# Patient Record
Sex: Female | Born: 2000 | Hispanic: No | Marital: Single | Smoking: Former smoker
Health system: Southern US, Community
[De-identification: ages and names within clinical notes are randomized; demographics above are authoritative.]

## PROBLEM LIST (undated history)

## (undated) DIAGNOSIS — F419 Anxiety disorder, unspecified: Secondary | ICD-10-CM

## (undated) DIAGNOSIS — F32A Depression, unspecified: Secondary | ICD-10-CM

## (undated) DIAGNOSIS — F909 Attention-deficit hyperactivity disorder, unspecified type: Secondary | ICD-10-CM

## (undated) DIAGNOSIS — F329 Major depressive disorder, single episode, unspecified: Secondary | ICD-10-CM

## (undated) DIAGNOSIS — Z8619 Personal history of other infectious and parasitic diseases: Secondary | ICD-10-CM

## (undated) DIAGNOSIS — U071 COVID-19: Secondary | ICD-10-CM

## (undated) HISTORY — DX: Personal history of other infectious and parasitic diseases: Z86.19

---

## 2018-07-15 ENCOUNTER — Emergency Department (HOSPITAL_COMMUNITY): Payer: BC Managed Care – PPO

## 2018-07-15 ENCOUNTER — Other Ambulatory Visit: Payer: Self-pay

## 2018-07-15 ENCOUNTER — Encounter (HOSPITAL_COMMUNITY): Payer: Self-pay

## 2018-07-15 ENCOUNTER — Emergency Department (HOSPITAL_COMMUNITY)
Admission: EM | Admit: 2018-07-15 | Discharge: 2018-07-15 | Disposition: A | Discharge status: Home / Self Care | Payer: BC Managed Care – PPO | Attending: Emergency Medicine | Admitting: Emergency Medicine

## 2018-07-15 DIAGNOSIS — N201 Calculus of ureter: Secondary | ICD-10-CM | POA: Diagnosis not present

## 2018-07-15 DIAGNOSIS — R109 Unspecified abdominal pain: Secondary | ICD-10-CM

## 2018-07-15 DIAGNOSIS — Z79899 Other long term (current) drug therapy: Secondary | ICD-10-CM | POA: Insufficient documentation

## 2018-07-15 HISTORY — DX: Depression, unspecified: F32.A

## 2018-07-15 HISTORY — DX: Anxiety disorder, unspecified: F41.9

## 2018-07-15 HISTORY — DX: Major depressive disorder, single episode, unspecified: F32.9

## 2018-07-15 LAB — URINALYSIS, ROUTINE W REFLEX MICROSCOPIC
Bilirubin Urine: NEGATIVE
Glucose, UA: NEGATIVE mg/dL
Ketones, ur: NEGATIVE mg/dL
Nitrite: NEGATIVE
Protein, ur: NEGATIVE mg/dL
RBC / HPF: >50 RBC/hpf — ABNORMAL HIGH (ref 0–5)
SPECIFIC GRAVITY, URINE: 1.006 (ref 1.005–1.030)
pH: 8 (ref 5.0–8.0)

## 2018-07-15 LAB — COMPREHENSIVE METABOLIC PANEL
ALT: 23 U/L (ref 0–44)
AST: 27 U/L (ref 15–41)
Albumin: 4.5 g/dL (ref 3.5–5.0)
Alkaline Phosphatase: 73 U/L (ref 38–126)
Anion gap: 8 (ref 5–15)
BUN: 8 mg/dL (ref 6–20)
CO2: 25 mmol/L (ref 22–32)
CREATININE: 0.82 mg/dL (ref 0.44–1.00)
Calcium: 9.3 mg/dL (ref 8.9–10.3)
Chloride: 106 mmol/L (ref 98–111)
GFR calc Af Amer: >60 mL/min (ref >60)
GFR calc non Af Amer: >60 mL/min (ref >60)
Glucose, Bld: 100 mg/dL — ABNORMAL HIGH (ref 70–99)
Potassium: 3.8 mmol/L (ref 3.5–5.1)
Sodium: 139 mmol/L (ref 135–145)
Total Bilirubin: 0.5 mg/dL (ref 0.3–1.2)
Total Protein: 8.5 g/dL — ABNORMAL HIGH (ref 6.5–8.1)

## 2018-07-15 LAB — CBC WITH DIFFERENTIAL/PLATELET
Abs Immature Granulocytes: 0.07 10*3/uL (ref 0.00–0.07)
Basophils Absolute: 0 10*3/uL (ref 0.0–0.1)
Basophils Relative: 0 %
Eosinophils Absolute: 0 10*3/uL (ref 0.0–0.5)
Eosinophils Relative: 0 %
HEMATOCRIT: 41.6 % (ref 36.0–46.0)
HEMOGLOBIN: 13.1 g/dL (ref 12.0–15.0)
Immature Granulocytes: 1 %
LYMPHS PCT: 9 %
Lymphs Abs: 0.9 10*3/uL (ref 0.7–4.0)
MCH: 28.2 pg (ref 26.0–34.0)
MCHC: 31.5 g/dL (ref 30.0–36.0)
MCV: 89.5 fL (ref 80.0–100.0)
MONO ABS: 0.5 10*3/uL (ref 0.1–1.0)
Monocytes Relative: 5 %
Neutro Abs: 8.5 10*3/uL — ABNORMAL HIGH (ref 1.7–7.7)
Neutrophils Relative %: 85 %
Platelets: 267 10*3/uL (ref 150–400)
RBC: 4.65 MIL/uL (ref 3.87–5.11)
RDW: 15.7 % — ABNORMAL HIGH (ref 11.5–15.5)
WBC: 10 10*3/uL (ref 4.0–10.5)
nRBC: 0 % (ref 0.0–0.2)

## 2018-07-15 LAB — LIPASE, BLOOD: Lipase: 31 U/L (ref 11–51)

## 2018-07-15 LAB — I-STAT BETA HCG BLOOD, ED (MC, WL, AP ONLY): I-stat hCG, quantitative: <5 m[IU]/mL (ref <5)

## 2018-07-15 MED ORDER — HYDROCODONE-ACETAMINOPHEN 5-325 MG PO TABS: 1.0000 | ORAL_TABLET | ORAL | 0 refills | Status: DC | PRN

## 2018-07-15 MED ORDER — SODIUM CHLORIDE 0.9 % IV BOLUS
1000.0000 mL | Freq: Once | INTRAVENOUS | Status: AC
  Administered 2018-07-15: 1000 mL via INTRAVENOUS

## 2018-07-15 MED ORDER — HYDROMORPHONE HCL 1 MG/ML IJ SOLN
0.5000 mg | Freq: Once | INTRAMUSCULAR | Status: AC
  Administered 2018-07-15: 0.5 mg via INTRAVENOUS
  Filled 2018-07-15: qty 1

## 2018-07-15 MED ORDER — KETOROLAC TROMETHAMINE 15 MG/ML IJ SOLN
15.0000 mg | Freq: Once | INTRAMUSCULAR | Status: AC
  Administered 2018-07-15: 15 mg via INTRAVENOUS
  Filled 2018-07-15: qty 1

## 2018-07-15 NOTE — ED Notes (Signed)
Ultrasound at bedside

## 2018-07-15 NOTE — ED Triage Notes (Signed)
Pt states pain in her right flank radiating to her abdomen. Pt states pain is worse with respirations. Pt states sharp pain started at 0600 this morning.

## 2018-07-15 NOTE — Discharge Instructions (Addendum)
Take ibuprofen and Tylenol for your pain.  If your pain is still severe, then take the hydrocodone/Norco.  Be cautious because this medicine also has Tylenol in it and you cannot have more than 4000 mg of Tylenol per day.  If you develop severe/intractable pain, fever, vomiting, urinary tract infection symptoms, or any other new/concerning symptoms then return to the ER for evaluation.

## 2018-07-15 NOTE — ED Provider Notes (Signed)
[facility]-EMERGENCY DEPT Provider Note   CSN: [id number] Arrival date & time: 07/15/18  1037     History   Chief Complaint Chief Complaint  Patient presents with  . Flank Pain    HPI Mary Carlson is a 18 y.o. female.  HPI  18 year old female presents with severe flank pain that started about 6 AM.  Woke her up from sleep.  She has pain that is in her right back, flank, and upper abdomen.  There is no real radiation of the pain.  It is sharp and burning and initially here was about an 8 out of 10 and currently about a 5.  She has not take anything for the pain.  Had some nausea but no vomiting.  She has felt like she needs to urinate a couple times but only small amounts come out and this causes some discomfort in her bladder.  No fevers or shortness of breath.  The pain is worse when she breathes in but no dyspnea or chest pain.  Has not taken anything for the pain today.  Past Medical History:  Diagnosis Date  . Anxiety   . Depression     There are no active problems to display for this patient.   History reviewed. No pertinent surgical history.   OB History   No obstetric history on file.      Home Medications    Prior to Admission medications   Medication Sig Start Date End Date Taking? Authorizing Provider  escitalopram (LEXAPRO) 20 MG tablet Take 20 mg by mouth daily. 05/25/18  Yes [provider]  etonogestrel (NEXPLANON) 68 MG IMPL implant 68 mg by Subdermal route once.   Yes [provider]  Multiple Vitamin (MULTIVITAMIN) tablet Take 1 tablet by mouth daily.   Yes [provider]  HYDROcodone-acetaminophen (NORCO) 5-325 MG tablet Take 1 tablet by mouth every 4 (four) hours as needed for severe pain. 07/15/18   Pricilla Loveless, MD    Family History No family history on file.  Social History Social History   Tobacco Use  . Smoking status: Never Smoker  . Smokeless tobacco: Never Used  Substance Use  Topics  . Alcohol use: Yes    Alcohol/week: 30.0 standard drinks    Types: 20 Cans of beer, 10 Shots of liquor per week  . Drug use: Never     Allergies   Other; Watermelon [citrullus vulgaris]; and Banana   Review of Systems Review of Systems  Constitutional: Negative for fever.  Respiratory: Negative for shortness of breath.   Cardiovascular: Negative for chest pain.  Gastrointestinal: Positive for abdominal pain and nausea. Negative for vomiting.  Genitourinary: Positive for dysuria and flank pain. Negative for hematuria and vaginal bleeding.  Musculoskeletal: Positive for back pain.  All other systems reviewed and are negative.    Physical Exam Updated Vital Signs BP 119/66   Pulse 80   Temp 98.3 F (36.8 C) (Oral)   Resp 16   Ht 5\' 5"  (1.651 m)   Wt 99.8 kg   SpO2 98%   BMI 36.61 kg/m   Physical Exam Vitals signs and nursing note reviewed.  Constitutional:      General: She is not in acute distress.    Appearance: She is well-developed. She is obese. She is not ill-appearing or diaphoretic.  HENT:     Head: Normocephalic and atraumatic.     Right Ear: External ear normal.     Left Ear: External ear normal.  Nose: Nose normal.  Eyes:     General:        Right eye: No discharge.        Left eye: No discharge.  Cardiovascular:     Rate and Rhythm: Normal rate and regular rhythm.     Heart sounds: Normal heart sounds.  Pulmonary:     Effort: Pulmonary effort is normal.     Breath sounds: Normal breath sounds.  Abdominal:     Palpations: Abdomen is soft.     Tenderness: There is abdominal tenderness in the right upper quadrant. There is right CVA tenderness.  Skin:    General: Skin is warm and dry.  Neurological:     Mental Status: She is alert.  Psychiatric:        Mood and Affect: Mood is not anxious.      ED Treatments / Results  Labs (all labs ordered are listed, but only abnormal results are displayed) Labs Reviewed  URINALYSIS,  ROUTINE W REFLEX MICROSCOPIC - Abnormal; Notable for the following components:      Result Value   Hgb urine dipstick LARGE (*)    Leukocytes, UA SMALL (*)    RBC / HPF >50 (*)    Bacteria, UA RARE (*)    All other components within normal limits  COMPREHENSIVE METABOLIC PANEL - Abnormal; Notable for the following components:   Glucose, Bld 100 (*)    Total Protein 8.5 (*)    All other components within normal limits  CBC WITH DIFFERENTIAL/PLATELET - Abnormal; Notable for the following components:   RDW 15.7 (*)    Neutro Abs 8.5 (*)    All other components within normal limits  LIPASE, BLOOD  I-STAT BETA HCG BLOOD, ED ([facility], [facility], [facility] ONLY)    EKG EKG Interpretation  Date/Time:  Sunday July 15 2018 10:55:01 EST Ventricular Rate:  76 PR Interval:    QRS Duration: 84 QT Interval:  394 QTC Calculation: 443 R Axis:   46 Text Interpretation:  Sinus rhythm Prolonged PR interval no acute ST/T changes No old tracing to compare Confirmed by Pricilla LovelessGoldston, Divine Hansley [phone]([id number]) on 07/15/2018 10:57:51 AM   Radiology [place] Renal  Result Date: 07/15/2018 CLINICAL DATA:  Acute onset of right flank pain this morning. EXAM: RENAL / URINARY TRACT ULTRASOUND COMPLETE COMPARISON:  None. FINDINGS: Right Kidney: Renal measurements: 12.7 x 4.9 x 4.7 cm = volume: 151 mL . Echogenicity within normal limits. No mass or hydronephrosis visualized. Left Kidney: Renal measurements: 10.9 x 5.0 x 5.0 cm = volume: 141 mL. Echogenicity within normal limits. No mass or hydronephrosis visualized. Bladder: Appears normal for degree of bladder distention. IMPRESSION: Negative. No evidence of hydronephrosis or other significant abnormality. Electronically Signed   By: Myles RosenthalJohn  Stahl M.D.   On: 07/15/2018 13:13   [place] Abdomen Limited Ruq  Result Date: 07/15/2018 CLINICAL DATA:  Right flank pain. EXAM: ULTRASOUND ABDOMEN LIMITED RIGHT UPPER QUADRANT COMPARISON:  None. FINDINGS: Gallbladder: No gallstones or wall thickening visualized. No  sonographic Murphy sign noted by sonographer. Common bile duct: Diameter: 2 mm, within normal limits Liver: No focal lesion identified. Within normal limits in parenchymal echogenicity. Portal vein is patent on color Doppler imaging with normal direction of blood flow towards the liver. IMPRESSION: Negative right upper quadrant ultrasound. Electronically Signed   By: Marin Robertshristopher  Mattern M.D.   On: 07/15/2018 13:19    Procedures Procedures (including critical care time)  Medications Ordered in ED Medications  sodium chloride 0.9 % bolus 1,000 mL (  0 mLs Intravenous Stopped 07/15/18 1244)  HYDROmorphone (DILAUDID) injection 0.5 mg (0.5 mg Intravenous Given 07/15/18 1133)  ketorolac (TORADOL) 15 MG/ML injection 15 mg (15 mg Intravenous Given 07/15/18 1435)     Initial Impression / Assessment and Plan / ED Course  I have reviewed the triage vital signs and the nursing notes.  Pertinent labs & imaging results that were available during my care of the patient were reviewed by me and considered in my medical decision making (see chart for details).     Patient's presentation is consistent with ureteral colic.  This will be her first time episode.  Pain is now controlled and pain is a 0 after IV Dilaudid and Toradol.  There is no lower abdominal tenderness and I highly doubt acute appendicitis.  Given there is some right upper tenderness, right upper quadrant ultrasound and LFTs/lipase obtained but these are benign.  There is significant hematuria.  I discussed with patient the option of getting a CT versus not.  I think at her age, I have very low suspicion for other acute intra-abdominal emergency such as AAA, appendicitis, or SBO.  Renal colic seems to be the most likely cause.  Thus we will treat symptomatically and if her symptoms worsen or do not improve then she is encouraged to return.  Otherwise follow-up with PCP and urology.  Final Clinical Impressions(s) / ED Diagnoses   Final diagnoses:    Acute right flank pain  Ureteral stone    ED Discharge Orders         Ordered    HYDROcodone-acetaminophen (NORCO) 5-325 MG tablet  Every 4 hours PRN     07/15/18 1414           Pricilla LovelessGoldston, Venora Kautzman, MD 07/15/18 1628

## 2019-02-06 ENCOUNTER — Ambulatory Visit (HOSPITAL_BASED_OUTPATIENT_CLINIC_OR_DEPARTMENT_OTHER): Admit: 2019-02-06 | Payer: BC Managed Care – PPO | Admitting: Otolaryngology

## 2019-02-06 ENCOUNTER — Encounter (HOSPITAL_BASED_OUTPATIENT_CLINIC_OR_DEPARTMENT_OTHER): Payer: Self-pay

## 2019-02-06 SURGERY — TONSILLECTOMY
Anesthesia: General | Laterality: Bilateral

## 2019-02-20 ENCOUNTER — Other Ambulatory Visit: Payer: Self-pay

## 2019-02-20 ENCOUNTER — Emergency Department (HOSPITAL_BASED_OUTPATIENT_CLINIC_OR_DEPARTMENT_OTHER)
Admission: EM | Admit: 2019-02-20 | Discharge: 2019-02-20 | Disposition: A | Discharge status: Home / Self Care | Payer: BC Managed Care – PPO | Attending: Emergency Medicine | Admitting: Emergency Medicine

## 2019-02-20 ENCOUNTER — Encounter (HOSPITAL_BASED_OUTPATIENT_CLINIC_OR_DEPARTMENT_OTHER): Payer: Self-pay | Admitting: *Deleted

## 2019-02-20 DIAGNOSIS — J029 Acute pharyngitis, unspecified: Secondary | ICD-10-CM

## 2019-02-20 DIAGNOSIS — J039 Acute tonsillitis, unspecified: Secondary | ICD-10-CM | POA: Diagnosis not present

## 2019-02-20 MED ORDER — PENICILLIN G BENZATHINE 1200000 UNIT/2ML IM SUSP
1.2000 10*6.[IU] | Freq: Once | INTRAMUSCULAR | Status: AC
  Administered 2019-02-20: 1.2 10*6.[IU] via INTRAMUSCULAR

## 2019-02-20 MED ORDER — ALUM & MAG HYDROXIDE-SIMETH 200-200-20 MG/5ML PO SUSP: 30.0000 mL | Freq: Once | ORAL | Status: DC

## 2019-02-20 MED ORDER — LIDOCAINE VISCOUS HCL 2 % MT SOLN
15.0000 mL | Freq: Once | OROMUCOSAL | Status: AC
  Administered 2019-02-20: 15 mL via ORAL
  Filled 2019-02-20: qty 15

## 2019-02-20 MED ORDER — DEXAMETHASONE SODIUM PHOSPHATE 10 MG/ML IJ SOLN
10.0000 mg | Freq: Once | INTRAMUSCULAR | Status: AC
  Administered 2019-02-20: 10 mg via INTRAMUSCULAR
  Filled 2019-02-20: qty 1

## 2019-02-20 MED ORDER — ACETAMINOPHEN 160 MG/5ML PO SOLN
1000.0000 mg | Freq: Once | ORAL | Status: AC
  Administered 2019-02-20: 1000 mg via ORAL
  Filled 2019-02-20: qty 40.6

## 2019-02-20 MED ORDER — PENICILLIN G BENZATHINE 1200000 UNIT/2ML IM SUSP
2.4000 10*6.[IU] | Freq: Once | INTRAMUSCULAR | Status: DC
  Filled 2019-02-20: qty 4

## 2019-02-20 NOTE — ED Provider Notes (Signed)
[facility] Emergency Department Provider Note MRN:  [id number]  Arrival date & time: 02/20/19     Chief Complaint   Sore Throat   History of Present Illness   Mary Carlson is a 18 y.o. year-old female with a history of recurrent tonsillitis presenting to the ED with chief complaint of sore throat.  2 or 3 days of worsening sore throat, enlarged tonsils.  She is scheduled to have her tonsils removed in a few days.  Pain is severely limiting her ability to eat or drink.  Fever noted for the past day.  Denies neck pain, no shortness of breath, no chest pain, no abdominal pain, no other complaints.  Review of Systems  A complete 10 system review of systems was obtained and all systems are negative except as noted in the HPI and PMH.   Patient's Health History    Past Medical History:  Diagnosis Date  . Anxiety   . Depression     History reviewed. No pertinent surgical history.  History reviewed. No pertinent family history.  Social History   Socioeconomic History  . Marital status: Single    Spouse name: Not on file  . Number of children: Not on file  . Years of education: Not on file  . Highest education level: Not on file  Occupational History  . Not on file  Social Needs  . Financial resource strain: Not on file  . Food insecurity    Worry: Not on file    Inability: Not on file  . Transportation needs    Medical: Not on file    Non-medical: Not on file  Tobacco Use  . Smoking status: Never Smoker  . Smokeless tobacco: Never Used  Substance and Sexual Activity  . Alcohol use: Yes    Alcohol/week: 30.0 standard drinks    Types: 20 Cans of beer, 10 Shots of liquor per week  . Drug use: Never  . Sexual activity: Not on file  Lifestyle  . Physical activity    Days per week: Not on file    Minutes per session: Not on file  . Stress: Not on file  Relationships  . Social Herbalist on phone: Not on file    Gets together:  Not on file    Attends religious service: Not on file    Active member of club or organization: Not on file    Attends meetings of clubs or organizations: Not on file    Relationship status: Not on file  . Intimate partner violence    Fear of current or ex partner: Not on file    Emotionally abused: Not on file    Physically abused: Not on file    Forced sexual activity: Not on file  Other Topics Concern  . Not on file  Social History Narrative  . Not on file     Physical Exam  Vital Signs and Nursing Notes reviewed Vitals:   02/20/19 1142  BP: (!) 141/73  Pulse: (!) 112  Resp: 18  Temp: (!) 102 F (38.9 C)  SpO2: 100%    CONSTITUTIONAL: Well-appearing, NAD NEURO:  Alert and oriented x 3, no focal deficits EYES:  eyes equal and reactive ENT/NECK:  no LAD, no JVD; moderately swollen and edematous tonsils and uvula and posterior oropharynx CARDIO: Tachycardic rate, well-perfused, normal S1 and S2 PULM:  CTAB no wheezing or rhonchi GI/GU:  normal bowel sounds, non-distended, non-tender MSK/SPINE:  No gross deformities,  no edema SKIN:  no rash, atraumatic PSYCH:  Appropriate speech and behavior  Diagnostic and Interventional Summary    Labs Reviewed  NOVEL CORONAVIRUS, NAA (HOSPITAL ORDER, SEND-OUT TO REF LAB)    No orders to display    Medications  dexamethasone (DECADRON) injection 10 mg (10 mg Intramuscular Given 02/20/19 1218)  lidocaine (XYLOCAINE) 2 % viscous mouth solution 15 mL (15 mLs Oral Given 02/20/19 1231)  acetaminophen (TYLENOL) solution 1,000 mg (1,000 mg Oral Given 02/20/19 1231)  penicillin g benzathine (BICILLIN LA) 1200000 UNIT/2ML injection 1.2 Million Units (1.2 Million Units Intramuscular Given 02/20/19 1233)     Procedures Critical Care  ED Course and Medical Decision Making  I have reviewed the triage vital signs and the nursing notes.  Pertinent labs & imaging results that were available during my care of the patient were reviewed by me and  considered in my medical decision making (see below for details).  Concern for recurrent strep throat and this 18 year old female with multiple episodes of the same, scheduled for tonsillectomy in a few days.  Her pain is severe enough to likely dehydrate her a bit today, she is not talking on exam due to the significant pain.  She is in no respiratory distress, patent airway.  Will treat empirically for strep throat with intramuscular penicillin, provide intramuscular Decadron for the swelling, provide liquid Tylenol with lidocaine and reassess.  Upon reassessment patient is feeling much better, speaking in full sentences, tachycardia is resolved.  She states she is hungry and wants to be discharged.  She is appropriate for follow-up with her surgeon for tonsillectomy.  Elmer SowMichael M. Pilar PlateBero, MD [facility] [email or web address]  Final Clinical Impressions(s) / ED Diagnoses     ICD-10-CM   1. Pharyngitis, unspecified etiology  J02.9   2. Tonsillitis  J03.90     ED Discharge Orders    None        Discharge Instructions     You were evaluated in the Emergency Department and after careful evaluation, we did not find any emergent condition requiring admission or further testing in the hospital.  Your symptoms today seem to be due to an infection of the back of the throat and the tonsils, likely due to strep throat.  We treated you here in the emergency department with a long-acting antibiotic shot as well as a steroid injection which should help your pain significantly.  We recommend continued Tylenol at home for discomfort and follow-up with your surgeon for removal of these tonsils.  Your coronavirus test should result within the next 2 days.  Until that time we recommend home quarantine and, if positive, we recommend continued quarantine per [facility] recommendations.  Please return to the Emergency Department if you experience any worsening of your  condition.  We encourage you to follow up with a primary care provider.  Thank you for allowing us to be a part of your care.       Sabas SousBero, Makinzy Cleere M, MD 02/20/19 [phone]

## 2019-02-20 NOTE — Discharge Instructions (Addendum)
You were evaluated in the Emergency Department and after careful evaluation, we did not find any emergent condition requiring admission or further testing in the hospital.  Your symptoms today seem to be due to an infection of the back of the throat and the tonsils, likely due to strep throat.  We treated you here in the emergency department with a long-acting antibiotic shot as well as a steroid injection which should help your pain significantly.  We recommend continued Tylenol at home for discomfort and follow-up with your surgeon for removal of these tonsils.  Your coronavirus test should result within the next 2 days.  Until that time we recommend home quarantine and, if positive, we recommend continued quarantine per [facility] recommendations.  Please return to the Emergency Department if you experience any worsening of your condition.  We encourage you to follow up with a primary care provider.  Thank you for allowing [place] to be a part of your care.

## 2019-02-20 NOTE — ED Notes (Signed)
Pt refused Corona test since she had one yesterday at [facility].

## 2019-02-20 NOTE — ED Triage Notes (Signed)
Pt reports sore throat and intermittent fevers x 3 days, scheduled for tonsillectomy next week. Tonsils are swollen and red.

## 2019-03-02 HISTORY — PX: TONSILLECTOMY: SHX5217

## 2019-04-02 ENCOUNTER — Encounter: Payer: Self-pay | Admitting: Physician Assistant

## 2019-04-02 ENCOUNTER — Other Ambulatory Visit: Payer: Self-pay

## 2019-04-02 ENCOUNTER — Other Ambulatory Visit (HOSPITAL_COMMUNITY)
Admission: RE | Admit: 2019-04-02 | Discharge: 2019-04-02 | Disposition: A | Discharge status: Home / Self Care | Payer: BC Managed Care – PPO | Source: Ambulatory Visit | Attending: Physician Assistant | Admitting: Physician Assistant

## 2019-04-02 ENCOUNTER — Ambulatory Visit (INDEPENDENT_AMBULATORY_CARE_PROVIDER_SITE_OTHER): Payer: BC Managed Care – PPO | Admitting: Physician Assistant

## 2019-04-02 VITALS — BP 114/78 | HR 78 | Temp 98.0°F | Ht 65.0 in | Wt 240.5 lb

## 2019-04-02 DIAGNOSIS — Z113 Encounter for screening for infections with a predominantly sexual mode of transmission: Secondary | ICD-10-CM | POA: Insufficient documentation

## 2019-04-02 DIAGNOSIS — F321 Major depressive disorder, single episode, moderate: Secondary | ICD-10-CM | POA: Diagnosis not present

## 2019-04-02 DIAGNOSIS — Z91018 Allergy to other foods: Secondary | ICD-10-CM | POA: Diagnosis not present

## 2019-04-02 DIAGNOSIS — Z23 Encounter for immunization: Secondary | ICD-10-CM

## 2019-04-02 DIAGNOSIS — F1010 Alcohol abuse, uncomplicated: Secondary | ICD-10-CM

## 2019-04-02 MED ORDER — EPINEPHRINE 0.3 MG/0.3ML IJ SOAJ: 0.3000 mg | INTRAMUSCULAR | 0 refills | Status: DC | PRN

## 2019-04-02 NOTE — Progress Notes (Signed)
Mary RumpfMakinzee Peabody is a 18 y.o. female here to Establish Care.  I acted as a Neurosurgeonscribe for Energy East CorporationSamantha Cattaleya Wien, PA-C Corky Mullonna Orphanos, LPN  History of Present Illness:   Chief Complaint  Patient presents with  . Establish Care  . Anxiety  . Depression  . STD testing    Acute Concerns: STD testing Pt would like to have testing done. Currently sexual active, condoms occasionally, more than one partner.  She states that about a few months ago she had chlamydia and gonorrhea, this was treated.  She had vaginal discharge at that time.  She would like recheck today, as she has been with at least 4 different partners over the past few months, and does not consistently use condoms.  She denies any symptoms today.  Chronic Issues: Anxiety & Depression Pt c/o anxiety and depression has gotten worse in the past 6 months. She is currently taking Lexapro 20 mg and does not feel like it is helping.  She was started on medication in the middle or high school.  She is somewhat of a poor historian.  She has been on Prozac and Zoloft prior to Lexapro.  She currently does not work or go to school.  She is planning to start working at [employer] soon.  She states that she has been coping with worsening depression by drinking more alcohol and vaping, and having sex.  She has never been diagnosed with any sort of mood disorder.  She was seeing a psychiatrist, she is unable to tell me who or even when, but she states that she was not very nice and she did not like her.  She was also seeing a counselor but she did not find that to be helpful.  She takes her medication every day.  She denies any suicidal thoughts or plans.  She does have a history of self-harm, would cut her arms in high school.  She does have a history of binging but not purging.  She reports strong family history of drug and alcohol abuse.  She has never been hospitalized for her mood.  She has one good friend that is a good support system for her, but that person  does not know the extent to how much she struggles with depression and anxiety.  Allergies Has multiple food allergies.  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  He needs a new EpiPen.  GAD 7 : Generalized Anxiety Score 04/02/2019  Nervous, Anxious, on Edge 3  Control/stop worrying 1  Worry too much - different things 2  Trouble relaxing 3  Restless 0  Easily annoyed or irritable 1  Afraid - awful might happen 3  Total GAD 7 Score 13  Anxiety Difficulty Very difficult    Depression screen PHQ 2/9 04/02/2019  Decreased Interest 3  Down, Depressed, Hopeless 2  PHQ - 2 Score 5  Altered sleeping 3  Tired, decreased energy 3  Change in appetite 3  Feeling bad or failure about yourself  3  Trouble concentrating 0  Moving slowly or fidgety/restless 0  Suicidal thoughts 0  PHQ-9 Score 17  Difficult doing work/chores Very difficult     Health Maintenance: Immunizations -- UTD, will give Flu vaccine today  Weight -- Weight: 240 lb 8 oz (109.1 kg)    [facility] 04/02/2019  Decreased Interest 3  Down, Depressed, Hopeless 2  PHQ - 2 Score 5  Altered sleeping 3  Tired, decreased energy 3  Change in appetite 3  Feeling  bad or failure about yourself  3  Trouble concentrating 0  Moving slowly or fidgety/restless 0  Suicidal thoughts 0  PHQ-9 Score 17  Difficult doing work/chores Very difficult    GAD 7 : Generalized Anxiety Score 04/02/2019  Nervous, Anxious, on Edge 3  Control/stop worrying 1  Worry too much - different things 2  Trouble relaxing 3  Restless 0  Easily annoyed or irritable 1  Afraid - awful might happen 3  Total GAD 7 Score 13  Anxiety Difficulty Very difficult     Other providers/specialists: Patient Care Team: Inda Coke, [place] as PCP - General (Physician Assistant)   Past Medical History:  Diagnosis Date  . Anxiety   . Depression   . History of chicken pox      Social History   Socioeconomic History  . Marital  status: Single    Spouse name: Not on file  . Number of children: Not on file  . Years of education: Not on file  . Highest education level: Not on file  Occupational History  . Not on file  Social Needs  . Financial resource strain: Not on file  . Food insecurity    Worry: Not on file    Inability: Not on file  . Transportation needs    Medical: Not on file    Non-medical: Not on file  Tobacco Use  . Smoking status: Current Every Day Smoker    Types: E-cigarettes  . Smokeless tobacco: Never Used  Substance and Sexual Activity  . Alcohol use: Yes    Alcohol/week: 15.0 standard drinks    Types: 15 Shots of liquor per week    Comment: 3 x's a week  . Drug use: Never  . Sexual activity: Yes    Birth control/protection: Implant  Lifestyle  . Physical activity    Days per week: Not on file    Minutes per session: Not on file  . Stress: Not on file  Relationships  . Social Herbalist on phone: Not on file    Gets together: Not on file    Attends religious service: Not on file    Active member of club or organization: Not on file    Attends meetings of clubs or organizations: Not on file    Relationship status: Not on file  . Intimate partner violence    Fear of current or ex partner: Not on file    Emotionally abused: Not on file    Physically abused: Not on file    Forced sexual activity: Not on file  Other Topics Concern  . Not on file  Social History Narrative  . Not on file    Past Surgical History:  Procedure Laterality Date  . TONSILLECTOMY N/A 03/02/2019    History reviewed. No pertinent family history.  Allergies  Allergen Reactions  . Other Anaphylaxis    Walnuts & other tree nuts  . Watermelon [Citrullus Vulgaris] Other (See Comments)    Itchy throat  . Banana Rash     Current Medications:   Current Outpatient Medications:  .  EPINEPHrine 0.3 mg/0.3 mL IJ SOAJ injection, Inject 0.3 mLs (0.3 mg total) into the muscle as needed for  anaphylaxis., Disp: 2 each, Rfl: 0 .  escitalopram (LEXAPRO) 20 MG tablet, Take 20 mg by mouth daily., Disp: , Rfl:  .  etonogestrel (NEXPLANON) 68 MG IMPL implant, 68 mg by Subdermal route once., Disp: , Rfl:    Review of Systems:  ROS Negative unless otherwise specified per HPI.  Vitals:   Vitals:   04/02/19 1342  BP: 114/78  Pulse: 78  Temp: 98 F (36.7 C)  TempSrc: Temporal  SpO2: 96%  Weight: 240 lb 8 oz (109.1 kg)  Height: 5\' 5"  (1.651 m)      Body mass index is 40.02 kg/m.  Physical Exam:   Physical Exam Constitutional:      Appearance: She is well-developed.  HENT:     Head: Normocephalic and atraumatic.  Eyes:     Conjunctiva/sclera: Conjunctivae normal.  Neck:     Musculoskeletal: Normal range of motion and neck supple.  Pulmonary:     Effort: Pulmonary effort is normal.  Musculoskeletal: Normal range of motion.  Skin:    General: Skin is warm and dry.  Neurological:     Mental Status: She is alert and oriented to person, place, and time.  Psychiatric:        Attention and Perception: Attention normal.        Mood and Affect: Affect is flat.        Speech: Speech normal.        Behavior: Behavior normal.        Thought Content: Thought content normal.        Judgment: Judgment normal.      Assessment and Plan:   Mary Carlson was seen today for establish care, anxiety, depression and std testing.  Diagnoses and all orders for this visit:  Screening examination for STD (sexually transmitted disease) STD screening done today.  I did recommend she abstain from sex until results return.  I also recommended that she always use condoms. -     Cancel: Urine cytology ancillary only -     HIV Antibody (routine testing w rflx) -     RPR -     Urine cytology ancillary only  Depression, major, single episode, moderate (HCC) Uncontrolled.  Denies suicidal ideation today.  Referral to psychiatry urgently. I discussed with patient that if they develop any  SI, to tell someone immediately and seek medical attention. Follow-up in 1 month.  Alcohol consumption binge drinking Counseled. Continue to encourage healthier coping mechanisms. Follow-up in 1 month.  Food allergy Will prescribe EpiPen.  Other orders -     EPINEPHrine 0.3 mg/0.3 mL IJ SOAJ injection; Inject 0.3 mLs (0.3 mg total) into the muscle as needed for anaphylaxis.    . Reviewed expectations re: course of current medical issues. . Discussed self-management of symptoms. . Outlined signs and symptoms indicating need for more acute intervention. . Patient verbalized understanding and all questions were answered. . See orders for this visit as documented in the electronic medical record. . Patient received an After-Visit Summary.  CMA or LPN served as scribe during this visit. History, Physical, and Plan performed by medical provider. The above documentation has been reviewed and is accurate and complete.   Darla Lesches, PA-C

## 2019-04-02 NOTE — Patient Instructions (Signed)
It was great to see you!  I will be in touch with your STD results, please abstain from sex until that time.  Always use condoms!  I am going to refer you to psychiatry for further evaluation of your depression and anxiety.  If you develop any suicidal thoughts, please tell someone you trust in the emergency room.  Take care,  Inda Coke PA-C

## 2019-04-02 NOTE — Addendum Note (Signed)
Addended by: Erlene Quan on: 04/02/2019 02:46 PM   Modules accepted: Orders

## 2019-04-03 LAB — URINE CYTOLOGY ANCILLARY ONLY
Chlamydia: NEGATIVE
Molecular Disclaimer: NEGATIVE
Molecular Disclaimer: NEGATIVE
Molecular Disclaimer: NORMAL
Neisseria Gonorrhea: NEGATIVE
Trichomonas: NEGATIVE

## 2019-04-03 LAB — HIV ANTIBODY (ROUTINE TESTING W REFLEX): HIV 1&2 Ab, 4th Generation: NONREACTIVE

## 2019-04-03 LAB — RPR: RPR Ser Ql: NONREACTIVE

## 2019-04-07 ENCOUNTER — Encounter: Payer: Self-pay | Admitting: Physician Assistant

## 2019-04-07 DIAGNOSIS — F419 Anxiety disorder, unspecified: Secondary | ICD-10-CM

## 2019-04-07 DIAGNOSIS — F321 Major depressive disorder, single episode, moderate: Secondary | ICD-10-CM

## 2019-04-08 ENCOUNTER — Telehealth (HOSPITAL_COMMUNITY): Payer: Self-pay | Admitting: Psychiatry

## 2019-04-08 NOTE — Telephone Encounter (Signed)
D:  Tamara (referral coordinator) referred pt to [facility].  A:  Placed call to orient pt, but there was no answer.  Left vm for pt to call case manager back.  Inform Jonelle Sidle.

## 2019-04-09 ENCOUNTER — Telehealth (HOSPITAL_COMMUNITY): Payer: Self-pay | Admitting: Psychiatry

## 2019-04-10 NOTE — Telephone Encounter (Signed)
Sam, do you want her to see Lattie Haw too? I do not see a referral for her just Psychiatry?

## 2019-04-25 ENCOUNTER — Encounter: Payer: Self-pay | Admitting: Physician Assistant

## 2019-04-26 ENCOUNTER — Other Ambulatory Visit: Payer: Self-pay | Admitting: Physician Assistant

## 2019-04-26 MED ORDER — ESCITALOPRAM OXALATE 20 MG PO TABS: 20.0000 mg | ORAL_TABLET | Freq: Every day | ORAL | 1 refills | Status: DC

## 2019-05-01 ENCOUNTER — Ambulatory Visit (HOSPITAL_COMMUNITY): Payer: Self-pay | Admitting: Licensed Clinical Social Worker

## 2019-05-05 ENCOUNTER — Ambulatory Visit (HOSPITAL_COMMUNITY)
Admission: RE | Admit: 2019-05-05 | Discharge: 2019-05-05 | Disposition: A | Discharge status: Home / Self Care | Payer: BC Managed Care – PPO | Source: Home / Self Care | Attending: Psychiatry | Admitting: Psychiatry

## 2019-05-05 ENCOUNTER — Emergency Department (HOSPITAL_COMMUNITY)
Admission: EM | Admit: 2019-05-05 | Discharge: 2019-05-06 | Disposition: A | Discharge status: Other Acute Inpatient Hospital | Payer: BC Managed Care – PPO | Source: Home / Self Care | Attending: Emergency Medicine | Admitting: Emergency Medicine

## 2019-05-05 DIAGNOSIS — Z20828 Contact with and (suspected) exposure to other viral communicable diseases: Secondary | ICD-10-CM | POA: Insufficient documentation

## 2019-05-05 DIAGNOSIS — Z79899 Other long term (current) drug therapy: Secondary | ICD-10-CM | POA: Insufficient documentation

## 2019-05-05 DIAGNOSIS — F17290 Nicotine dependence, other tobacco product, uncomplicated: Secondary | ICD-10-CM | POA: Insufficient documentation

## 2019-05-05 DIAGNOSIS — F332 Major depressive disorder, recurrent severe without psychotic features: Secondary | ICD-10-CM | POA: Insufficient documentation

## 2019-05-05 DIAGNOSIS — R45851 Suicidal ideations: Secondary | ICD-10-CM

## 2019-05-05 DIAGNOSIS — F322 Major depressive disorder, single episode, severe without psychotic features: Secondary | ICD-10-CM | POA: Insufficient documentation

## 2019-05-05 NOTE — BH Assessment (Signed)
Assessment Note  Mary Carlson is an 18 y.o. female.  -Patient was brought in by her father.  Patient does consent to father being in the room during the assessment.   Patient says that she "was about to overdose tonight."  She appears anxious and says that "everything is stressing me out."  Patient said that she recently lost her job.  She was anticipating moving out from her parents home and into a apartment with roommates.  Now she does not know what is going to happen.  Patient currently is endorsing suicidal plan to overdose on medications.  She says "if I leave here I will do it."   Patient denies any HI.  When asked about A/V hallucinations she says "I hear a voice telling me I'm not worth anything, to kill myself."  Patient says this is an internal voice.  Patient has fair eye contact.  She is crying and asking to be admitted.  Patient is not responding to internal stimuli.  The questions she does answer are logical and coherent. Patient says she cannot answer anymore questions because she feels like she is going to have a anxiety attack.  Pt has no previous inpatient care.  Her current medications (Lexapro) are prescribed by a Dr. Inda Coke.  Pt has an appoint ment with Dr. Toy Care in January '21.  -Clinician discussed patient care with Lindon Romp, FNP.  He recommends inpatient psychiatric care.  Mayra Neer, [facility] explained to patient and father about pt needing COVID testing and therefore needing to go to [facility].  It was explained that when discharges are done at [facility] tomorrow (11/02) that patient will then be considered for inpatient care.  Pt meets inpatient care criteria.    Diagnosis: F32.2 MDD single episode severe;   Past Medical History:  Past Medical History:  Diagnosis Date  . Anxiety   . Depression   . History of chicken pox     Past Surgical History:  Procedure Laterality Date  . TONSILLECTOMY N/A 03/02/2019    Family History: No family history on  file.  Social History:  reports that she has been smoking e-cigarettes. She has never used smokeless tobacco. She reports current alcohol use of about 15.0 standard drinks of alcohol per week. She reports that she does not use drugs.  Additional Social History:  Alcohol / Drug Use Pain Medications: None Prescriptions: Lexapro 20mg  once daily Over the Counter: Birth control History of alcohol / drug use?: Yes Substance #1 Name of Substance 1: ETOH 1 - Age of First Use: "can't remember" 1 - Amount (size/oz): Varies 1 - Frequency: 2-3 Times 1 - Duration: ongoing 1 - Last Use / Amount: 05/04/19 "Not much"  CIWA:   COWS:    Allergies:  Allergies  Allergen Reactions  . Other Anaphylaxis    Walnuts & other tree nuts  . Watermelon [Citrullus Vulgaris] Other (See Comments)    Itchy throat  . Banana Rash    Home Medications: (Not in a hospital admission)   OB/GYN Status:  No LMP recorded. Patient has had an implant.  General Assessment Data Location of Assessment: [facility] Assessment Services TTS Assessment: In system Is this a Tele or Face-to-Face Assessment?: Face-to-Face Is this an Initial Assessment or a Re-assessment for this encounter?: Initial Assessment Patient Accompanied by:: Parent Language Other than English: No Living Arrangements: Other (Comment)(Living with parents.) What gender do you identify as?: Female Marital status: Single Pregnancy Status: No Living Arrangements: Parent Can pt return to current living  arrangement?: Yes Admission Status: Voluntary Is patient capable of signing voluntary admission?: Yes Referral Source: Self/Family/Friend([facility] Insurance type: State health plan  Medical Screening Exam [facility] Walk-in ONLY) Medical Exam completed: Yes(Jason Allyson SabalBerry, FNP.)  Crisis Care Plan Living Arrangements: Parent Name of Psychiatrist: None Name of Therapist: None  Education Status Is patient currently in school?: No Is the  patient employed, unemployed or receiving disability?: Unemployed  Risk to self with the past 6 months Suicidal Ideation: Yes-Currently Present Has patient been a risk to self within the past 6 months prior to admission? : No Suicidal Intent: Yes-Currently Present Has patient had any suicidal intent within the past 6 months prior to admission? : No Is patient at risk for suicide?: Yes("If I go back home I'll kill myself") Suicidal Plan?: Yes-Currently Present Has patient had any suicidal plan within the past 6 months prior to admission? : No Specify Current Suicidal Plan: Overdose Access to Means: Yes Specify Access to Suicidal Means: Medications What has been your use of drugs/alcohol within the last 12 months?: EToH Previous Attempts/Gestures: No How many times?: 0 Other Self Harm Risks: None reported Triggers for Past Attempts: None known Intentional Self Injurious Behavior: None Family Suicide History: Unknown Recent stressful life event(s): Job Loss, Turmoil (Comment)(Pt says "everything" has been stressful) Persecutory voices/beliefs?: Yes Depression: Yes Depression Symptoms: Despondent, Tearfulness, Isolating, Fatigue, Loss of interest in usual pleasures, Feeling worthless/self pity Substance abuse history and/or treatment for substance abuse?: No Suicide prevention information given to non-admitted patients: Not applicable  Risk to Others within the past 6 months Homicidal Ideation: No Does patient have any lifetime risk of violence toward others beyond the six months prior to admission? : No Thoughts of Harm to Others: No Current Homicidal Intent: No Current Homicidal Plan: No Access to Homicidal Means: No Identified Victim: No one History of harm to others?: No Assessment of Violence: None Noted Violent Behavior Description: None reported Does patient have access to weapons?: No Criminal Charges Pending?: No Does patient have a court date: No Is patient on  probation?: No  Psychosis Hallucinations: None noted Delusions: None noted  Mental Status Report Appearance/Hygiene: Unremarkable Eye Contact: Fair Motor Activity: Freedom of movement, Unremarkable Speech: Logical/coherent Level of Consciousness: Alert, Crying Mood: Anxious, Depressed, Helpless, Sad Affect: Anxious, Depressed, Sad Anxiety Level: Severe Thought Processes: Coherent, Relevant Judgement: Unimpaired Orientation: Person, Place, Time, Situation Obsessive Compulsive Thoughts/Behaviors: None  Cognitive Functioning Concentration: Poor Memory: Remote Intact, Recent Intact Is patient IDD: No Insight: Fair Impulse Control: Poor Appetite: Poor Have you had any weight changes? : No Change Sleep: Increased Total Hours of Sleep: 10 Vegetative Symptoms: Staying in bed  ADLScreening [facility] Assessment Services) Patient's cognitive ability adequate to safely complete daily activities?: Yes Patient able to express need for assistance with ADLs?: Yes Independently performs ADLs?: Yes (appropriate for developmental age)  Prior Inpatient Therapy Prior Inpatient Therapy: No  Prior Outpatient Therapy Prior Outpatient Therapy: Yes Prior Therapy Dates: Pt can't recall Prior Therapy Facilty/Provider(s): Pt can't recall Reason for Treatment: depression Does patient have an ACCT team?: No Does patient have Intensive In-House Services?  : No Does patient have [facility] services? : No Does patient have P4CC services?: No  ADL Screening (condition at time of admission) Patient's cognitive ability adequate to safely complete daily activities?: Yes Is the patient deaf or have difficulty hearing?: No Does the patient have difficulty seeing, even when wearing glasses/contacts?: No(Uses contacts) Does the patient have difficulty concentrating, remembering, or making decisions?: No  Patient able to express need for assistance with ADLs?: Yes Does the patient have difficulty dressing or  bathing?: No Independently performs ADLs?: Yes (appropriate for developmental age) Does the patient have difficulty walking or climbing stairs?: No Weakness of Legs: None Weakness of Arms/Hands: None       Abuse/Neglect Assessment (Assessment to be complete while patient is alone) Abuse/Neglect Assessment Can Be Completed: Yes Physical Abuse: Denies Verbal Abuse: Denies Sexual Abuse: Denies Exploitation of patient/patient's resources: Denies Self-Neglect: Denies     Merchant navy officer (For Healthcare) Does Patient Have a Medical Advance Directive?: No Would patient like information on creating a medical advance directive?: No - Patient declined          Disposition:  Disposition Initial Assessment Completed for this Encounter: Yes Disposition of Patient: Movement to [facility] or [facility] ED Patient refused recommended treatment: No Mode of transportation if patient is discharged/movement?: [facility](Safe Transport) Patient referred to: Other (Comment)([facility] when daytime staff arrange admission)  On Site Evaluation by:   Reviewed with Physician:    Alexandria Lodge 05/05/2019 11:32 PM

## 2019-05-05 NOTE — BH Assessment (Signed)
[facility] Assessment Progress Note   Clinician called [facility] to let charge nurse know about pt being transported there.  Secretary Estill Bamberg took message for charge nurse Maylon Cos.    Pt meet inpatient psychiatric care criteria.  SI w/ stated plan & intention.  If patient talks about leaving or attempts, EDP needs to initiate IVC process.  Pt is voluntary however.    There are no appropriate beds at [place].  Pt to be reviewed by daytime administrative staff for possible bed assignment.  Pt will need COVID test.

## 2019-05-06 ENCOUNTER — Encounter (HOSPITAL_COMMUNITY): Payer: Self-pay | Admitting: *Deleted

## 2019-05-06 ENCOUNTER — Inpatient Hospital Stay (HOSPITAL_COMMUNITY)
Admission: AD | Admit: 2019-05-06 | Discharge: 2019-05-08 | DRG: 885 | Disposition: A | Discharge status: Home / Self Care | Payer: BC Managed Care – PPO | Source: Intra-hospital | Attending: Psychiatry | Admitting: Psychiatry

## 2019-05-06 ENCOUNTER — Other Ambulatory Visit: Payer: Self-pay

## 2019-05-06 ENCOUNTER — Encounter (HOSPITAL_COMMUNITY): Payer: Self-pay

## 2019-05-06 DIAGNOSIS — G4700 Insomnia, unspecified: Secondary | ICD-10-CM | POA: Diagnosis present

## 2019-05-06 DIAGNOSIS — E119 Type 2 diabetes mellitus without complications: Secondary | ICD-10-CM | POA: Diagnosis present

## 2019-05-06 DIAGNOSIS — F411 Generalized anxiety disorder: Secondary | ICD-10-CM | POA: Diagnosis present

## 2019-05-06 DIAGNOSIS — Z20828 Contact with and (suspected) exposure to other viral communicable diseases: Secondary | ICD-10-CM | POA: Diagnosis present

## 2019-05-06 DIAGNOSIS — R45851 Suicidal ideations: Secondary | ICD-10-CM | POA: Diagnosis present

## 2019-05-06 DIAGNOSIS — F4310 Post-traumatic stress disorder, unspecified: Secondary | ICD-10-CM | POA: Diagnosis present

## 2019-05-06 DIAGNOSIS — F332 Major depressive disorder, recurrent severe without psychotic features: Secondary | ICD-10-CM | POA: Diagnosis present

## 2019-05-06 DIAGNOSIS — F17290 Nicotine dependence, other tobacco product, uncomplicated: Secondary | ICD-10-CM | POA: Diagnosis present

## 2019-05-06 LAB — CBC
HCT: 44.8 % (ref 36.0–46.0)
Hemoglobin: 13.8 g/dL (ref 12.0–15.0)
MCH: 27.7 pg (ref 26.0–34.0)
MCHC: 30.8 g/dL (ref 30.0–36.0)
MCV: 89.8 fL (ref 80.0–100.0)
Platelets: 269 10*3/uL (ref 150–400)
RBC: 4.99 MIL/uL (ref 3.87–5.11)
RDW: 15.9 % — ABNORMAL HIGH (ref 11.5–15.5)
WBC: 7.5 10*3/uL (ref 4.0–10.5)
nRBC: 0 % (ref 0.0–0.2)

## 2019-05-06 LAB — ACETAMINOPHEN LEVEL: Acetaminophen (Tylenol), Serum: <10 ug/mL — ABNORMAL LOW (ref 10–30)

## 2019-05-06 LAB — COMPREHENSIVE METABOLIC PANEL
ALT: 37 U/L (ref 0–44)
AST: 37 U/L (ref 15–41)
Albumin: 4.1 g/dL (ref 3.5–5.0)
Alkaline Phosphatase: 94 U/L (ref 38–126)
Anion gap: 10 (ref 5–15)
BUN: 11 mg/dL (ref 6–20)
CO2: 20 mmol/L — ABNORMAL LOW (ref 22–32)
Calcium: 9.1 mg/dL (ref 8.9–10.3)
Chloride: 106 mmol/L (ref 98–111)
Creatinine, Ser: 0.74 mg/dL (ref 0.44–1.00)
GFR calc Af Amer: >60 mL/min (ref >60)
GFR calc non Af Amer: >60 mL/min (ref >60)
Glucose, Bld: 93 mg/dL (ref 70–99)
Potassium: 4 mmol/L (ref 3.5–5.1)
Sodium: 136 mmol/L (ref 135–145)
Total Bilirubin: 0.4 mg/dL (ref 0.3–1.2)
Total Protein: 8.3 g/dL — ABNORMAL HIGH (ref 6.5–8.1)

## 2019-05-06 LAB — ETHANOL: Alcohol, Ethyl (B): <10 mg/dL (ref <10)

## 2019-05-06 LAB — I-STAT BETA HCG BLOOD, ED (MC, WL, AP ONLY): I-stat hCG, quantitative: <5 m[IU]/mL (ref <5)

## 2019-05-06 LAB — SARS CORONAVIRUS 2 (TAT 6-24 HRS): SARS Coronavirus 2: NEGATIVE

## 2019-05-06 LAB — RAPID URINE DRUG SCREEN, HOSP PERFORMED
Amphetamines: NOT DETECTED
Barbiturates: NOT DETECTED
Benzodiazepines: NOT DETECTED
Cocaine: NOT DETECTED
Opiates: NOT DETECTED
Tetrahydrocannabinol: NOT DETECTED

## 2019-05-06 LAB — SALICYLATE LEVEL: Salicylate Lvl: <7 mg/dL (ref 2.8–30.0)

## 2019-05-06 MED ORDER — ONDANSETRON HCL 4 MG PO TABS: 4.0000 mg | ORAL_TABLET | Freq: Three times a day (TID) | ORAL | Status: DC | PRN

## 2019-05-06 MED ORDER — ALUM & MAG HYDROXIDE-SIMETH 200-200-20 MG/5ML PO SUSP: 30.0000 mL | Freq: Four times a day (QID) | ORAL | Status: DC | PRN

## 2019-05-06 MED ORDER — NICOTINE POLACRILEX 2 MG MT GUM
2.0000 mg | CHEWING_GUM | OROMUCOSAL | Status: DC | PRN
  Administered 2019-05-06: 2 mg via ORAL

## 2019-05-06 MED ORDER — ACETAMINOPHEN 325 MG PO TABS: 650.0000 mg | ORAL_TABLET | ORAL | Status: DC | PRN

## 2019-05-06 MED ORDER — MAGNESIUM HYDROXIDE 400 MG/5ML PO SUSP: 30.0000 mL | Freq: Every day | ORAL | Status: DC | PRN

## 2019-05-06 MED ORDER — TRAZODONE HCL 50 MG PO TABS
50.0000 mg | ORAL_TABLET | Freq: Every evening | ORAL | Status: DC | PRN
  Administered 2019-05-06 – 2019-05-07 (×2): 50 mg via ORAL
  Filled 2019-05-06 (×2): qty 1

## 2019-05-06 MED ORDER — ACETAMINOPHEN 325 MG PO TABS: 650.0000 mg | ORAL_TABLET | Freq: Four times a day (QID) | ORAL | Status: DC | PRN

## 2019-05-06 MED ORDER — EPINEPHRINE 0.3 MG/0.3ML IJ SOAJ: 0.3000 mg | INTRAMUSCULAR | Status: DC | PRN

## 2019-05-06 MED ORDER — HYDROXYZINE HCL 25 MG PO TABS
25.0000 mg | ORAL_TABLET | Freq: Three times a day (TID) | ORAL | Status: DC | PRN
  Administered 2019-05-06: 25 mg via ORAL
  Filled 2019-05-06: qty 1

## 2019-05-06 MED ORDER — ALUM & MAG HYDROXIDE-SIMETH 200-200-20 MG/5ML PO SUSP: 30.0000 mL | ORAL | Status: DC | PRN

## 2019-05-06 NOTE — Tx Team (Signed)
Initial Treatment Plan 05/06/2019 6:01 PM Mary Carlson GXQ:[id number]    PATIENT STRESSORS: Marital or family conflict Occupational concerns Substance abuse   PATIENT STRENGTHS: Average or above average intelligence Capable of independent living Communication skills General fund of knowledge Motivation for treatment/growth Physical Health Supportive family/friends Work skills   PATIENT IDENTIFIED PROBLEMS: Suicidal ideation  depression  anxiety  Substance abuse               DISCHARGE CRITERIA:  Ability to meet basic life and health needs Improved stabilization in mood, thinking, and/or behavior Motivation to continue treatment in a less acute level of care Need for constant or close observation no longer present Reduction of life-threatening or endangering symptoms to within safe limits Verbal commitment to aftercare and medication compliance Withdrawal symptoms are absent or subacute and managed without 24-hour nursing intervention  PRELIMINARY DISCHARGE PLAN: Outpatient therapy Return to previous living arrangement  PATIENT/FAMILY INVOLVEMENT: This treatment plan has been presented to and reviewed with the patient, Mary Carlson, and/or family member.  The patient and family have been given the opportunity to ask questions and make suggestions.  Judie Petit, RN 05/06/2019, 6:01 PM

## 2019-05-06 NOTE — Progress Notes (Signed)
The patient was informed by this Pryor Curia that she could not have another patient visit her in her bedroom.

## 2019-05-06 NOTE — ED Provider Notes (Signed)
[facility] [place] DEPT Provider Note   CSN: [id number] Arrival date & time: 05/05/19  2327     History   Chief Complaint Chief Complaint  Patient presents with  . Medical Clearance    HPI Mary Carlson is a 18 y.o. female.     The history is provided by the patient.  Mental Health Problem Presenting symptoms: depression and suicidal thoughts   Presenting symptoms: no agitation and no suicide attempt   Degree of incapacity (severity):  Severe Onset quality:  Gradual Timing:  Constant Progression:  Worsening Chronicity:  New Context: not recent medication change and not stressful life event   Treatment compliance:  All of the time Relieved by:  Nothing Worsened by:  Nothing Ineffective treatments:  None tried Associated symptoms: no abdominal pain, no anxiety, no appetite change, no chest pain, no decreased need for sleep, no headaches, no insomnia, no irritability and no school problems   Risk factors: hx of mental illness     Past Medical History:  Diagnosis Date  . Anxiety   . Depression   . History of chicken pox     There are no active problems to display for this patient.   Past Surgical History:  Procedure Laterality Date  . TONSILLECTOMY N/A 03/02/2019     OB History   No obstetric history on file.      Home Medications    Prior to Admission medications   Medication Sig Start Date End Date Taking? Authorizing Provider  EPINEPHrine 0.3 mg/0.3 mL IJ SOAJ injection Inject 0.3 mLs (0.3 mg total) into the muscle as needed for anaphylaxis. 04/02/19  Yes Inda Coke, PA  escitalopram (LEXAPRO) 20 MG tablet Take 1 tablet (20 mg total) by mouth daily. 04/26/19  Yes Inda Coke, PA  etonogestrel (NEXPLANON) 68 MG IMPL implant 68 mg by Subdermal route once.   Yes [provider]    Family History No family history on file.  Social History Social History   Tobacco Use  . Smoking status: Current Every Day  Smoker    Types: E-cigarettes  . Smokeless tobacco: Never Used  Substance Use Topics  . Alcohol use: Yes    Alcohol/week: 15.0 standard drinks    Types: 15 Shots of liquor per week    Comment: 3 x's a week  . Drug use: Never     Allergies   Other, Watermelon [citrullus vulgaris], and Banana   Review of Systems Review of Systems  Constitutional: Negative for appetite change and irritability.  HENT: Negative for congestion.   Eyes: Negative for visual disturbance.  Respiratory: Negative for shortness of breath.   Cardiovascular: Negative for chest pain.  Gastrointestinal: Negative for abdominal pain.  Endocrine: Negative for polyuria.  Genitourinary: Negative for difficulty urinating.  Musculoskeletal: Negative for arthralgias.  Neurological: Negative for headaches.  Psychiatric/Behavioral: Positive for suicidal ideas. Negative for agitation. The patient is not nervous/anxious and does not have insomnia.   All other systems reviewed and are negative.    Physical Exam Updated Vital Signs BP (!) 144/81 (BP Location: Left Arm)   Pulse 77   Temp 98.5 F (36.9 C) (Oral)   Resp 16   SpO2 99%   Physical Exam Vitals signs and nursing note reviewed.  Constitutional:      Appearance: Normal appearance.  HENT:     Head: Normocephalic and atraumatic.     Nose: Nose normal.  Eyes:     Conjunctiva/sclera: Conjunctivae normal.     Pupils: Pupils  are equal, round, and reactive to light.  Neck:     Musculoskeletal: Normal range of motion and neck supple.  Cardiovascular:     Rate and Rhythm: Normal rate and regular rhythm.     Pulses: Normal pulses.     Heart sounds: Normal heart sounds.  Pulmonary:     Effort: Pulmonary effort is normal.     Breath sounds: Normal breath sounds.  Abdominal:     General: Abdomen is flat. Bowel sounds are normal.     Tenderness: There is no abdominal tenderness. There is no guarding.  Musculoskeletal: Normal range of motion.  Skin:     General: Skin is warm and dry.     Capillary Refill: Capillary refill takes less than 2 seconds.  Neurological:     General: No focal deficit present.     Mental Status: She is alert and oriented to person, place, and time.     Deep Tendon Reflexes: Reflexes normal.  Psychiatric:        Thought Content: Thought content includes suicidal ideation. Thought content includes suicidal plan.      ED Treatments / Results  Labs (all labs ordered are listed, but only abnormal results are displayed) Results for orders placed or performed during the hospital encounter of 05/05/19  cbc  Result Value Ref Range   WBC 7.5 4.0 - 10.5 K/uL   RBC 4.99 3.87 - 5.11 MIL/uL   Hemoglobin 13.8 12.0 - 15.0 g/dL   HCT 13.2 44.0 - 10.2 %   MCV 89.8 80.0 - 100.0 fL   MCH 27.7 26.0 - 34.0 pg   MCHC 30.8 30.0 - 36.0 g/dL   RDW 72.5 (H) 36.6 - 44.0 %   Platelets 269 150 - 400 K/uL   nRBC 0.0 0.0 - 0.2 %  Rapid urine drug screen (hospital performed)  Result Value Ref Range   Opiates NONE DETECTED NONE DETECTED   Cocaine NONE DETECTED NONE DETECTED   Benzodiazepines NONE DETECTED NONE DETECTED   Amphetamines NONE DETECTED NONE DETECTED   Tetrahydrocannabinol NONE DETECTED NONE DETECTED   Barbiturates NONE DETECTED NONE DETECTED  I-Stat beta hCG blood, ED  Result Value Ref Range   I-stat hCG, quantitative <5.0 <5 mIU/mL   Comment 3           No results found.  Radiology No results found.  Procedures Procedures (including critical care time)  Medications Ordered in ED Medications - No data to display   Initial Impression / Assessment and Plan / ED Course    Mary Carlson was evaluated in Emergency Department on 05/06/2019 for the symptoms described in the history of present illness. She was evaluated in the context of the global COVID-19 pandemic, which necessitated consideration that the patient might be at risk for infection with the SARS-CoV-2 virus that causes COVID-19. Institutional  protocols and algorithms that pertain to the evaluation of patients at risk for COVID-19 are in a state of rapid change based on information released by regulatory bodies including the CDC and federal and state organizations. These policies and algorithms were followed during the patient's care in the ED.   Final Clinical Impressions(s) / ED Diagnoses   Medically cleared by me for psychiatry, disposition per that service.     Blade Scheff, MD 05/06/19 [phone]

## 2019-05-06 NOTE — ED Notes (Signed)
Jamie from lab called back to state Covid test will come off machine at 1440. Taquita RN made aware

## 2019-05-06 NOTE — Progress Notes (Signed)
Psychoeducational Group Note  Date:  05/06/2019 Time:  2055  Group Topic/Focus:  Wrap-Up Group:   The focus of this group is to help patients review their daily goal of treatment and discuss progress on daily workbooks.  Participation Level: Did Not Attend  Participation Quality:  Not Applicable  Affect:  Not Applicable  Cognitive:  Not Applicable  Insight:  Not Applicable  Engagement in Group: Not Applicable  Additional Comments:  The patient refused to attend group until after the staff searched thru her bags.   Archie Balboa S 05/06/2019, 8:55 PM

## 2019-05-06 NOTE — ED Notes (Signed)
Called lab to try and obtain results on Covid Test, They will call back after they follow up on it.

## 2019-05-06 NOTE — BH Assessment (Signed)
[facility] Assessment Progress Note  Per Shuvon Rankin, FNP, this pt requires psychiatric hospitalization at this time.  Nonah Mattes, RN has assigned pt to [facility] Rm 301-2, pending negative Covid-19 test results.  Results have since come back and are negative.  Pt has signed Voluntary Admission and Consent for Treatment, as well as Consent to Release Information to pt's mother, and signed forms have been faxed to [facility].  Pt's nurse, Lisette Grinder, has been notified, and agrees to send original paperwork along with pt via [employer], and to call report to [phone].  Jalene Mullet, [place] Coordinator [phone]

## 2019-05-06 NOTE — H&P (Signed)
Behavioral Health Medical Screening Exam  Mary Carlson is an 18 y.o. female.  Total Time spent with patient: 15 minutes  Psychiatric Specialty Exam: Physical Exam  Constitutional: She is oriented to person, place, and time. She appears well-developed and well-nourished. No distress.  HENT:  Head: Normocephalic.  Respiratory: Effort normal. No respiratory distress.  Musculoskeletal: Normal range of motion.  Neurological: She is alert and oriented to person, place, and time.  Skin: She is not diaphoretic.  Psychiatric: Her mood appears anxious. Her speech is not delayed and not tangential. She is not withdrawn and not actively hallucinating. Thought content is not paranoid and not delusional. She exhibits a depressed mood. She expresses suicidal ideation. She expresses no homicidal ideation. She expresses suicidal plans.    Review of Systems  Constitutional: Negative for chills, diaphoresis, fever and malaise/fatigue.  Respiratory: Negative for cough and shortness of breath.   Gastrointestinal: Negative for diarrhea, nausea and vomiting.  Psychiatric/Behavioral: Positive for depression and suicidal ideas. Negative for hallucinations and memory loss. The patient is nervous/anxious.     There were no vitals taken for this visit.There is no height or weight on file to calculate BMI.  General Appearance: Casual and Well Groomed  Eye Contact:  Good  Speech:  Clear and Coherent and Normal Rate  Volume:  Normal  Mood:  Anxious, Depressed and Hopeless  Affect:  Congruent, Depressed and Tearful  Thought Process:  Coherent and Descriptions of Associations: Intact  Orientation:  Full (Time, Place, and Person)  Thought Content:  Logical and Hallucinations: None  Suicidal Thoughts:  Yes.  with intent/plan  Homicidal Thoughts:  No  Memory:  Immediate;   Good  Judgement:  Fair  Insight:  Lacking  Psychomotor Activity:  Normal  Concentration: Concentration: Fair  Recall:  Good  Fund of  Knowledge:Good  Language: Good  Akathisia:  Negative  Handed:    AIMS (if indicated):     Assets:  Communication Skills Desire for Improvement Financial Resources/Insurance Housing Leisure Time Physical Health Transportation  Sleep:       Musculoskeletal: Strength & Muscle Tone: within normal limits Gait & Station: normal Patient leans: N/A  There were no vitals taken for this visit.  Recommendations:  Based on my evaluation the patient does not appear to have an emergency medical condition.  Disposition: Recommend psychiatric Inpatient admission when medically cleared. Rozetta Nunnery, NP 05/06/2019, 5:43 AM

## 2019-05-06 NOTE — ED Notes (Signed)
Pt continues to rest with sitter at bedside

## 2019-05-06 NOTE — Progress Notes (Signed)
Pt accepted to [facility]; bed 301-2   Dr. Parke Poisson is the accepting provider.    Dr. Parke Poisson is the attending provider.    Call report to [phone]    Pt is voluntary and can be transported by Ruth    Pt may be transported to [facility] after her Covid results return as negative.   Audree Camel, LCSW, [place] Disposition [facility] BHH/TTS [phone]

## 2019-05-06 NOTE — ED Notes (Signed)
Still no results on Covid test, moving pt to TCU. Taquita RN aware

## 2019-05-06 NOTE — ED Notes (Signed)
Report called to [facility] to [employer]

## 2019-05-06 NOTE — Progress Notes (Signed)
Pt admitted to [facility] adult unit due to suicidal ideation.  Pt stated she had a plan to OD on bleach and cut herself.  Pt reported stressors to include being fired last week and moving out of her parent's home this month.  Pt text her mother and told her she was having "bad" thoughts and her mother knew what that meant.  Both parents entered her room before she was able to act on her plan.  Pt stated she wanted to come in to get help.  Pt continued to endorse suicidal ideation, contracts for safety.  Pt vapes and uses ETOH.  Denied AVH and HI.  Fifteen minute checks initiated for patient safety.  Pt safe on unit.

## 2019-05-06 NOTE — ED Triage Notes (Signed)
Pt arrived from [facility] stating she needs help that she is suicidal, states that she does not want to answer any questions because she just did it all at [facility]. Was told to get medically cleared here before getting a bed.

## 2019-05-06 NOTE — ED Notes (Signed)
Spoke with [place], pt has assigned bed 302-1 after Covid test results

## 2019-05-07 DIAGNOSIS — R45851 Suicidal ideations: Secondary | ICD-10-CM

## 2019-05-07 DIAGNOSIS — F332 Major depressive disorder, recurrent severe without psychotic features: Principal | ICD-10-CM

## 2019-05-07 MED ORDER — BUSPIRONE HCL 10 MG PO TABS
10.0000 mg | ORAL_TABLET | Freq: Two times a day (BID) | ORAL | Status: DC
  Administered 2019-05-07 – 2019-05-08 (×3): 10 mg via ORAL
  Filled 2019-05-07 (×7): qty 1

## 2019-05-07 MED ORDER — TRIAMCINOLONE 0.1 % CREAM:EUCERIN CREAM 1:1
TOPICAL_CREAM | Freq: Three times a day (TID) | CUTANEOUS | Status: DC
  Administered 2019-05-07: 12:00:00 via TOPICAL
  Filled 2019-05-07: qty 1

## 2019-05-07 MED ORDER — LORAZEPAM 1 MG PO TABS: 1.0000 mg | ORAL_TABLET | Freq: Four times a day (QID) | ORAL | Status: DC | PRN

## 2019-05-07 MED ORDER — SERTRALINE HCL 25 MG PO TABS
25.0000 mg | ORAL_TABLET | Freq: Every day | ORAL | Status: DC
  Administered 2019-05-07 – 2019-05-08 (×2): 25 mg via ORAL
  Filled 2019-05-07 (×4): qty 1

## 2019-05-07 MED ORDER — FOLIC ACID 1 MG PO TABS
1.0000 mg | ORAL_TABLET | Freq: Every day | ORAL | Status: DC
  Administered 2019-05-07 – 2019-05-08 (×2): 1 mg via ORAL
  Filled 2019-05-07 (×5): qty 1

## 2019-05-07 MED ORDER — VITAMIN B-1 100 MG PO TABS
100.0000 mg | ORAL_TABLET | Freq: Every day | ORAL | Status: DC
  Administered 2019-05-07 – 2019-05-08 (×2): 100 mg via ORAL
  Filled 2019-05-07 (×5): qty 1

## 2019-05-07 NOTE — BHH Counselor (Signed)
CSW attempted to engage with the patient for the second time, to complete assessment. Patient was asleep in her room and did not respond to her name being called multiple times. Patient reported earlier today, that she did not feel well.   CSW will continue to follow to complete assessment at a later time.   Radonna Ricker, MSW, LCSW Clinical Social Worker [facility]  Phone: [phone]

## 2019-05-07 NOTE — Progress Notes (Signed)
Patient ID: Mary Carlson, female   DOB: May 05, 2001, 18 y.o.   MRN: [id number]   D: Patient has a flat affect on approach tonight. Wants to be called "Mary Carlson". Having passive SI today at admission. Interacting well with other peers tonight.  A: Staff will monitor on q 15 minute checks, follow treatment plan, and give medications as ordered. R: Cooperative on the unit

## 2019-05-07 NOTE — Progress Notes (Signed)
D:  Patient's self inventory sheet, patient sleeps good, sleep medication helpful.  Good appetite, low energy level, poor concentration.  Rated depression 4, hopeless 5, denied anxiety.  Nicotine withdrawals, irritability.  Denied SI.  Physical problems, lightheaded, dizzy.  Denied pysical pain.  Goal get better.  Plans to attend group tonight.  Does have discharge plans. A:  Medications administered per MD orders.  Emotional support and encouragement given patient. R:  Denied SI and HI, contracts for safety.  Denied A/V hallucinations.  Safety maintained with 15 minute checks.

## 2019-05-07 NOTE — Progress Notes (Signed)
Recreation Therapy Notes  Animal-Assisted Activity (AAA) Program Checklist/Progress Notes Patient Eligibility Criteria Checklist & Daily Group note for Rec Tx Intervention  Date: 11.3.20 Time: 24 Location: 300 Hall Group Room   AAA/T Program Assumption of Risk Form signed by Patient/ or Parent Legal Guardian  YES   Patient is free of allergies or sever asthma YES   Patient reports no fear of animals  YES   Patient reports no history of cruelty to animals  YES   Patient understands his/her participation is voluntary  YES   Patient washes hands before animal contact  YES   Patient washes hands after animal contact  YES   Behavioral Response: Engaged  Education: Contractor, Appropriate Animal Interaction   Education Outcome: Acknowledges understanding/In group clarification offered/Needs additional education.   Clinical Observations/Feedback: Pt attended and participated in activity.    Victorino Sparrow, LRT/CTRS         Victorino Sparrow A 05/07/2019 3:30 PM

## 2019-05-07 NOTE — BHH Counselor (Signed)
CSW attempted to complete assessment with the patient, however the patient stated she felt dizzy and did not feel well enough to participate.   CSW will attempt to complete assessment with the patient at a later time.     Radonna Ricker, MSW, LCSW Clinical Social Worker [facility]  Phone: [phone]

## 2019-05-07 NOTE — Progress Notes (Signed)
Patient ID: Mary Carlson, female   DOB: 16-Jun-2001, 18 y.o.   MRN: [id number]   Refused lab draw this am, which consisted of TSH, HgA1C, and Lipids
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
## 2019-05-08 LAB — LIPID PANEL
Cholesterol: 175 mg/dL (ref 0–200)
HDL: 62 mg/dL (ref >40)
LDL Cholesterol: 87 mg/dL (ref 0–99)
Total CHOL/HDL Ratio: 2.8 RATIO
Triglycerides: 132 mg/dL (ref <150)
VLDL: 26 mg/dL (ref 0–40)

## 2019-05-08 LAB — TSH: TSH: 2.649 u[IU]/mL (ref 0.350–4.500)

## 2019-05-08 LAB — HEMOGLOBIN A1C
Hgb A1c MFr Bld: 6.1 % — ABNORMAL HIGH (ref 4.8–5.6)
Mean Plasma Glucose: 128.37 mg/dL

## 2019-05-08 MED ORDER — SERTRALINE HCL 25 MG PO TABS: 25.0000 mg | ORAL_TABLET | Freq: Every day | ORAL | 0 refills | Status: DC

## 2019-05-08 MED ORDER — TRAZODONE HCL 50 MG PO TABS: 50.0000 mg | ORAL_TABLET | Freq: Every evening | ORAL | 0 refills | Status: DC | PRN

## 2019-05-08 MED ORDER — NICOTINE POLACRILEX 2 MG MT GUM: 2.0000 mg | CHEWING_GUM | OROMUCOSAL | 0 refills | Status: DC | PRN

## 2019-05-08 MED ORDER — BUSPIRONE HCL 10 MG PO TABS: 10.0000 mg | ORAL_TABLET | Freq: Two times a day (BID) | ORAL | 0 refills | Status: DC

## 2019-05-08 NOTE — Progress Notes (Signed)
  [facility] Adult Case Management Discharge Plan :  Will you be returning to the same living situation after discharge:  Yes,  patient plans to return home with her parents At discharge, do you have transportation home?: Yes,  patient's mother is picking up at discharge Do you have the ability to pay for your medications: Yes,  BCBS  Release of information consent forms completed and in the chart;  Patient's signature needed at discharge.  Patient to Follow up at: Follow-up [facility]. Go on 05/17/2019.   Why: Appointment for medication management is11/13/20 at 1:00pm with Rosezella Rumpf, NP. Appointment for therapy is 05/23/19 at 4:00pm with Ria Bush. Please be sure to call at discharge to provide a $20 deposit and a copy of your insurance card.  Contact information: [place] [address] [phone]           Next level of care provider has access to [place] and Suicide Prevention discussed: Yes,  with the patient's mother     Has patient been referred to the Quitline?: N/A patient is not a smoker  Patient has been referred for addiction treatment: N/A  Marylee Floras, LCSWA 05/08/2019, 2:14 PM

## 2019-05-08 NOTE — Progress Notes (Addendum)
Patient ID: Mary Carlson, female   DOB: 2000/09/03, 18 y.o.   MRN: [id number]   [place] NOVEL CORONAVIRUS (COVID-19) DAILY CHECK-OFF SYMPTOMS - answer yes or no to each - every day NO YES  Have you had a fever in the past 24 hours?  . Fever (Temp > 37.80C / 100F) X   Have you had any of these symptoms in the past 24 hours? . New Cough .  Sore Throat  .  Shortness of Breath .  Difficulty Breathing .  Unexplained Body Aches   X   Have you had any one of these symptoms in the past 24 hours not related to allergies?   . Runny Nose .  Nasal Congestion .  Sneezing   X   If you have had runny nose, nasal congestion, sneezing in the past 24 hours, has it worsened?  X   EXPOSURES - check yes or no X   Have you traveled outside the state in the past 14 days?  X   Have you been in contact with someone with a confirmed diagnosis of COVID-19 or PUI in the past 14 days without wearing appropriate PPE?  X   Have you been living in the same home as a person with confirmed diagnosis of COVID-19 or a PUI (household contact)?    X   Have you been diagnosed with COVID-19?    X              What to do next: Answered NO to all: Answered YES to anything:   Proceed with unit schedule Follow the [facility] Inpatient Flowsheet.

## 2019-05-08 NOTE — Progress Notes (Signed)
[facility] MD Progress Note  05/08/2019 11:22 AM Mary Carlson  MRN:  [id number] Subjective: Patient is a 18 year old female with a past psychiatric history significant for major depression, generalized anxiety disorder as well as posttraumatic stress disorder who presented to the behavioral health hospital on 05/05/2019 with suicidal ideation.  Objective: Patient is seen and examined.  Patient is a 18 year old female with the above-stated past psychiatric history who is seen in follow-up.  She stated she feels better today.  She is a bit sedated secondary to the trazodone she took last night for sleep.  She denied any suicidal ideation today.  She stated she was feeling better, but did feel sedated.  We discussed decreasing the trazodone at bedtime to 25 mg instead of 50.  She denied any side effects to starting the sertraline 25 mg p.o. daily.  She has not had any withdrawal symptoms.  Her vital signs are stable, she is afebrile.  6 hours last night.  Review of her laboratories revealed essentially normal electrolytes, normal CBC, a hemoglobin A1c of 6.1.  Normal TSH and negative drug screen.  Principal Problem: <principal problem not specified> Diagnosis: Active Problems:   Severe recurrent major depression without psychotic features ([place])  Total Time spent with patient: 20 minutes  Past Psychiatric History: See admission H&P  Past Medical History:  Past Medical History:  Diagnosis Date  . Anxiety   . Depression   . History of chicken pox     Past Surgical History:  Procedure Laterality Date  . TONSILLECTOMY N/A 03/02/2019   Family History: History reviewed. No pertinent family history. Family Psychiatric  History: See admission H&P Social History:  Social History   Substance and Sexual Activity  Alcohol Use Yes  . Alcohol/week: 15.0 standard drinks  . Types: 15 Shots of liquor per week   Comment: 3 x's a week     Social History   Substance and Sexual Activity  Drug Use Never     Social History   Socioeconomic History  . Marital status: Single    Spouse name: Not on file  . Number of children: Not on file  . Years of education: Not on file  . Highest education level: Not on file  Occupational History  . Not on file  Social Needs  . Financial resource strain: Not on file  . Food insecurity    Worry: Not on file    Inability: Not on file  . Transportation needs    Medical: Not on file    Non-medical: Not on file  Tobacco Use  . Smoking status: Current Every Day Smoker    Types: E-cigarettes  . Smokeless tobacco: Never Used  Substance and Sexual Activity  . Alcohol use: Yes    Alcohol/week: 15.0 standard drinks    Types: 15 Shots of liquor per week    Comment: 3 x's a week  . Drug use: Never  . Sexual activity: Yes    Birth control/protection: Implant  Lifestyle  . Physical activity    Days per week: Not on file    Minutes per session: Not on file  . Stress: Not on file  Relationships  . Social Herbalist on phone: Not on file    Gets together: Not on file    Attends religious service: Not on file    Active member of club or organization: Not on file    Attends meetings of clubs or organizations: Not on file    Relationship status:  Not on file  Other Topics Concern  . Not on file  Social History Narrative  . Not on file   Additional Social History:                         Sleep: Fair  Appetite:  Fair  Current Medications: Current Facility-Administered Medications  Medication Dose Route Frequency Provider Last Rate Last Dose  . acetaminophen (TYLENOL) tablet 650 mg  650 mg Oral Q6H PRN Nira ConnBerry, Jason A, NP      . alum & mag hydroxide-simeth (MAALOX/MYLANTA) 200-200-20 MG/5ML suspension 30 mL  30 mL Oral Q4H PRN Nira ConnBerry, Jason A, NP      . busPIRone (BUSPAR) tablet 10 mg  10 mg Oral BID Antonieta Pertlary, Luvenia Cranford Lawson, MD   10 mg at 05/08/19 1051  . EPINEPHrine (EPI-PEN) injection 0.3 mg  0.3 mg Intramuscular PRN Nira ConnBerry, Jason A, NP       . folic acid (FOLVITE) tablet 1 mg  1 mg Oral Daily Antonieta Pertlary, Aliea Bobe Lawson, MD   1 mg at 05/08/19 1051  . hydrOXYzine (ATARAX/VISTARIL) tablet 25 mg  25 mg Oral TID PRN Jackelyn PolingBerry, Jason A, NP   25 mg at 05/06/19 1903  . LORazepam (ATIVAN) tablet 1 mg  1 mg Oral Q6H PRN Antonieta Pertlary, Rubye Strohmeyer Lawson, MD      . magnesium hydroxide (MILK OF MAGNESIA) suspension 30 mL  30 mL Oral Daily PRN Nira ConnBerry, Jason A, NP      . nicotine polacrilex (NICORETTE) gum 2 mg  2 mg Oral PRN Cobos, Rockey SituFernando A, MD   2 mg at 05/06/19 1904  . sertraline (ZOLOFT) tablet 25 mg  25 mg Oral Daily Antonieta Pertlary, Kalyani Maeda Lawson, MD   25 mg at 05/08/19 1051  . thiamine (VITAMIN B-1) tablet 100 mg  100 mg Oral Daily Antonieta Pertlary, Markevion Lattin Lawson, MD   100 mg at 05/08/19 1051  . traZODone (DESYREL) tablet 50 mg  50 mg Oral QHS PRN Nira ConnBerry, Jason A, NP   50 mg at 05/07/19 2304  . triamcinolone 0.1 % cream : eucerin cream, 1:1   Topical TID Antonieta Pertlary, Makaylie Dedeaux Lawson, MD        Lab Results:  Results for orders placed or performed during the hospital encounter of 05/06/19 (from the past 48 hour(s))  Hemoglobin A1c     Status: Abnormal   Collection Time: 05/08/19  6:30 AM  Result Value Ref Range   Hgb A1c MFr Bld 6.1 (H) 4.8 - 5.6 %    Comment: (NOTE) Pre diabetes:          5.7%-6.4% Diabetes:              >6.4% Glycemic control for   <7.0% adults with diabetes    Mean Plasma Glucose 128.37 mg/dL    Comment: Performed at [facility], [address] [place], [place] [address]  Lipid panel     Status: None   Collection Time: 05/08/19  6:30 AM  Result Value Ref Range   Cholesterol 175 0 - 200 mg/dL   Triglycerides 284132 <132<150 mg/dL   HDL 62 >44>40 mg/dL   Total CHOL/HDL Ratio 2.8 RATIO   VLDL 26 0 - 40 mg/dL   LDL Cholesterol 87 0 - 99 mg/dL    Comment:        Total Cholesterol/HDL:CHD Risk Coronary Heart Disease Risk Table  Men   Women  1/2 Average Risk   3.4   3.3  Average Risk       5.0   4.4  2 X Average Risk   9.6   7.1  3 X Average Risk   23.4   11.0        Use the calculated Patient Ratio above and the CHD Risk Table to determine the patient's CHD Risk.        ATP III CLASSIFICATION (LDL):  <100     mg/dL   Optimal  366-440  mg/dL   Near or Above                    Optimal  130-159  mg/dL   Borderline  347-425  mg/dL   High  >956     mg/dL   Very High Performed at [facility], [address], [place], [place] [address]   TSH     Status: None   Collection Time: 05/08/19  6:30 AM  Result Value Ref Range   TSH 2.649 0.350 - 4.500 uIU/mL    Comment: Performed by a 3rd Generation assay with a functional sensitivity of <=0.01 uIU/mL. Performed at [facility], [address], [place], [place] [address]     Blood Alcohol level:  Lab Results  Component Value Date   ETH <10 05/06/2019    Metabolic Disorder Labs: Lab Results  Component Value Date   HGBA1C 6.1 (H) 05/08/2019   MPG 128.37 05/08/2019   No results found for: PROLACTIN Lab Results  Component Value Date   CHOL 175 05/08/2019   TRIG 132 05/08/2019   HDL 62 05/08/2019   CHOLHDL 2.8 05/08/2019   VLDL 26 05/08/2019   LDLCALC 87 05/08/2019    Physical Findings: AIMS: Facial and Oral Movements Muscles of Facial Expression: None, normal Lips and Perioral Area: None, normal Jaw: None, normal Tongue: None, normal,Extremity Movements Upper (arms, wrists, hands, fingers): None, normal Lower (legs, knees, ankles, toes): None, normal, Trunk Movements Neck, shoulders, hips: None, normal, Overall Severity Severity of abnormal movements (highest score from questions above): None, normal Incapacitation due to abnormal movements: None, normal Patient's awareness of abnormal movements (rate only patient's report): No Awareness, Dental Status Current problems with teeth and/or dentures?: No Does patient usually wear dentures?: No  CIWA:  CIWA-Ar Total: 1 COWS:  COWS Total Score: 2  Musculoskeletal: Strength & Muscle  Tone: within normal limits Gait & Station: normal Patient leans: N/A  Psychiatric Specialty Exam: Physical Exam  Nursing note and vitals reviewed. Constitutional: She is oriented to person, place, and time. She appears well-developed and well-nourished.  HENT:  Head: Normocephalic and atraumatic.  Respiratory: Effort normal.  Neurological: She is alert and oriented to person, place, and time.    ROS  Blood pressure 111/66, pulse 79, temperature 98.4 F (36.9 C), temperature source Oral, resp. rate 18, height 5\' 5"  (1.651 m), weight 114.3 kg, SpO2 96 %.Body mass index is 41.93 kg/m.  General Appearance: Casual  Eye Contact:  Fair  Speech:  Normal Rate  Volume:  Normal  Mood:  Sleepy  Affect:  Congruent  Thought Process:  Coherent and Descriptions of Associations: Intact  Orientation:  Full (Time, Place, and Person)  Thought Content:  Logical  Suicidal Thoughts:  No  Homicidal Thoughts:  No  Memory:  Immediate;   Fair Recent;   Fair Remote;   Fair  Judgement:  Fair  Insight:  Fair  Psychomotor Activity:  Decreased  Concentration:  Concentration: Fair and Attention Span: Fair  Recall:  [employer] of Knowledge:  Fair  Language:  Good  Akathisia:  Negative  Handed:  Right  AIMS (if indicated):     Assets:  Desire for Improvement Resilience  ADL's:  Intact  Cognition:  WNL  Sleep:  Number of Hours: 6     Treatment Plan Summary: Daily contact with patient to assess and evaluate symptoms and progress in treatment, Medication management and Plan : Patient is seen and examined.  Patient is a 18 year old female with the above-stated past psychiatric history who is seen in follow-up.   Diagnosis: #1 major depression, recurrent, severe without psychotic features, #2 generalized anxiety disorder, #3 posttraumatic stress disorder, #4 new diagnosis type 2 diabetes.  Patient is seen in follow-up.  She is slowly improving although slightly sedated this morning secondary to the  trazodone.  The only change in her medications today will be decreasing the trazodone to 25 mg p.o. nightly as needed.  No other changes to her medicines.  If she continues to improve we will consider discharge in 1 to 2 days. 1.  Continue BuSpar 10 mg p.o. twice daily for anxiety. 2.  Continue folic acid 1 mg p.o. daily for nutritional supplementation. 3.  Continue hydroxyzine 25 mg p.o. 3 times daily as needed anxiety. 4.  Continue lorazepam 1 mg p.o. every 6 hours as needed a CIWA greater than 10. 5.  Continue Zoloft 25 mg p.o. daily for depression and anxiety. 6.  Continue thiamine 100 mg p.o. daily for nutritional supplementation. 7.  Discussed with patient new diagnosis of type 2 diabetes.  Will recommend outpatient diabetic teaching. 8.  Disposition planning-in progress. Antonieta Pert, MD 05/08/2019, 11:22 AM

## 2019-05-08 NOTE — Progress Notes (Signed)
   05/07/19 2200  Psych Admission Type (Psych Patients Only)  Admission Status Voluntary  Psychosocial Assessment  Patient Complaints Insomnia  Eye Contact Fair  Facial Expression Anxious  Affect Appropriate to circumstance  Speech Logical/coherent  Interaction Assertive  Motor Activity Fidgety  Appearance/Hygiene Unremarkable  Behavior Characteristics Anxious  Mood Anxious  Thought Process  Coherency WDL  Content WDL  Delusions None reported or observed  Perception WDL  Hallucination None reported or observed  Judgment WDL  Confusion None  Danger to Self  Current suicidal ideation? Denies  Danger to Others  Danger to Others None reported or observed

## 2019-05-08 NOTE — Progress Notes (Signed)
Patient ID: Mary Carlson, female   DOB: 2000/11/05, 18 y.o.   MRN: [id number]   D: Pt alert and oriented on the unit.   A: Education, support, and encouragement provided. Discharge summary, medications and follow up appointments reviewed with pt. Suicide prevention resources provided, including "My 3 App." Pt's belongings in locker # 17 returned and belongings sheet signed.  R: Pt denies SI/HI, A/VH, pain, or any concerns at this time. Pt ambulatory on and off unit. Pt discharged to lobby.
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
## 2019-05-08 NOTE — Tx Team (Signed)
Interdisciplinary Treatment and Diagnostic Plan Update  05/08/2019 Time of Session: 9:00am Caly Pellum MRN: [id number]  Principal Diagnosis: <principal problem not specified>  Secondary Diagnoses: Active Problems:   Severe recurrent major depression without psychotic features (HCC)   Current Medications:  Current Facility-Administered Medications  Medication Dose Route Frequency Provider Last Rate Last Dose  . acetaminophen (TYLENOL) tablet 650 mg  650 mg Oral Q6H PRN Lindon Romp A, NP      . alum & mag hydroxide-simeth (MAALOX/MYLANTA) 200-200-20 MG/5ML suspension 30 mL  30 mL Oral Q4H PRN Lindon Romp A, NP      . busPIRone (BUSPAR) tablet 10 mg  10 mg Oral BID Sharma Covert, MD   10 mg at 05/07/19 1700  . EPINEPHrine (EPI-PEN) injection 0.3 mg  0.3 mg Intramuscular PRN Lindon Romp A, NP      . folic acid (FOLVITE) tablet 1 mg  1 mg Oral Daily Sharma Covert, MD   1 mg at 05/07/19 1132  . hydrOXYzine (ATARAX/VISTARIL) tablet 25 mg  25 mg Oral TID PRN Rozetta Nunnery, NP   25 mg at 05/06/19 1903  . LORazepam (ATIVAN) tablet 1 mg  1 mg Oral Q6H PRN Sharma Covert, MD      . magnesium hydroxide (MILK OF MAGNESIA) suspension 30 mL  30 mL Oral Daily PRN Lindon Romp A, NP      . nicotine polacrilex (NICORETTE) gum 2 mg  2 mg Oral PRN Cobos, Myer Peer, MD   2 mg at 05/06/19 1904  . sertraline (ZOLOFT) tablet 25 mg  25 mg Oral Daily Sharma Covert, MD   25 mg at 05/07/19 1130  . thiamine (VITAMIN B-1) tablet 100 mg  100 mg Oral Daily Sharma Covert, MD   100 mg at 05/07/19 1132  . traZODone (DESYREL) tablet 50 mg  50 mg Oral QHS PRN Lindon Romp A, NP   50 mg at 05/07/19 2304  . triamcinolone 0.1 % cream : eucerin cream, 1:1   Topical TID Sharma Covert, MD       PTA Medications: Medications Prior to Admission  Medication Sig Dispense Refill Last Dose  . EPINEPHrine 0.3 mg/0.3 mL IJ SOAJ injection Inject 0.3 mLs (0.3 mg total) into the muscle as needed for  anaphylaxis. 2 each 0   . escitalopram (LEXAPRO) 20 MG tablet Take 1 tablet (20 mg total) by mouth daily. 90 tablet 1   . etonogestrel (NEXPLANON) 68 MG IMPL implant 68 mg by Subdermal route once.       Patient Stressors: Marital or family conflict Occupational concerns Substance abuse  Patient Strengths: Average or above average intelligence Capable of independent living Communication skills General fund of knowledge Motivation for treatment/growth Physical Health Supportive family/friends Work skills  Treatment Modalities: Medication Management, Group therapy, Case management,  1 to 1 session with clinician, Psychoeducation, Recreational therapy.   Physician Treatment Plan for Primary Diagnosis: <principal problem not specified> Long Term Goal(s):     Short Term Goals:    Medication Management: Evaluate patient's response, side effects, and tolerance of medication regimen.  Therapeutic Interventions: 1 to 1 sessions, Unit Group sessions and Medication administration.  Evaluation of Outcomes: Not Met  Physician Treatment Plan for Secondary Diagnosis: Active Problems:   Severe recurrent major depression without psychotic features ([place])  Long Term Goal(s):     Short Term Goals:       Medication Management: Evaluate patient's response, side effects, and tolerance of medication regimen.  Therapeutic  Interventions: 1 to 1 sessions, Unit Group sessions and Medication administration.  Evaluation of Outcomes: Not Met   RN Treatment Plan for Primary Diagnosis: <principal problem not specified> Long Term Goal(s): Knowledge of disease and therapeutic regimen to maintain health will improve  Short Term Goals: Ability to verbalize feelings will improve, Ability to identify and develop effective coping behaviors will improve and Compliance with prescribed medications will improve  Medication Management: RN will administer medications as ordered by provider, will assess and  evaluate patient's response and provide education to patient for prescribed medication. RN will report any adverse and/or side effects to prescribing provider.  Therapeutic Interventions: 1 on 1 counseling sessions, Psychoeducation, Medication administration, Evaluate responses to treatment, Monitor vital signs and CBGs as ordered, Perform/monitor CIWA, COWS, AIMS and Fall Risk screenings as ordered, Perform wound care treatments as ordered.  Evaluation of Outcomes: Not Met   LCSW Treatment Plan for Primary Diagnosis: <principal problem not specified> Long Term Goal(s): Safe transition to appropriate next level of care at discharge, Engage patient in therapeutic group addressing interpersonal concerns.  Short Term Goals: Engage patient in aftercare planning with referrals and resources, Increase social support, Increase emotional regulation, Identify triggers associated with mental health/substance abuse issues and Increase skills for wellness and recovery  Therapeutic Interventions: Assess for all discharge needs, 1 to 1 time with Social worker, Explore available resources and support systems, Assess for adequacy in community support network, Educate family and significant other(s) on suicide prevention, Complete Psychosocial Assessment, Interpersonal group therapy.  Evaluation of Outcomes: Met   Progress in Treatment: Attending groups: No. Participating in groups: No. Taking medication as prescribed: Yes. Toleration medication: Yes. Family/Significant other contact made: No, will contact:  supports if consents are granted. Patient understands diagnosis: No. Discussing patient identified problems/goals with staff: No. Medical problems stabilized or resolved: Yes. Denies suicidal/homicidal ideation: Yes. Issues/concerns per patient self-inventory: No.  New problem(s) identified: No, Describe:  none  New Short Term/Long Term Goal(s): medication management for mood stabilization;  elimination of SI thoughts; development of comprehensive mental wellness/sobriety plan.  Patient Goals:  Patient refused to participate in treatment team or meet with CSW.  Discharge Plan or Barriers: CSW assessing for appropriate referrals.  Reason for Continuation of Hospitalization: Anxiety Depression  Estimated Length of Stay: 1-3 days  Attendees: Patient:  05/08/2019 8:57 AM  Physician: Queen Blossom 05/08/2019 8:57 AM  Nursing: Elberta Fortis RN 05/08/2019 8:57 AM  RN Care Manager: 05/08/2019 8:57 AM  Social Worker: Stephanie Acre, [place] 05/08/2019 8:57 AM  Recreational Therapist:  05/08/2019 8:57 AM  Other: Harriett Sine, NP 05/08/2019 8:57 AM  Other:  05/08/2019 8:57 AM  Other: 05/08/2019 8:57 AM    Scribe for Treatment Team: Joellen Jersey, [place] 05/08/2019 8:57 AM

## 2019-05-08 NOTE — Progress Notes (Signed)
Recreation Therapy Notes  Date:  11.4.20 Time: 0930 Location: [address] Group Room  Group Topic: Stress Management  Goal Area(s) Addresses:  Patient will identify positive stress management techniques. Patient will identify benefits of using stress management post d/c.  Intervention: Stress Management  Activity :  Guided Imagery.  LRT introduced the stress management technique of guided imagery.  LRT read a script that took patients on a mental vacation to the beach.  Patients were to listen as LRT read the script to engage in activity.  Education:  Stress Management, Discharge Planning.   Education Outcome: Acknowledges Education  Clinical Observations/Feedback:  Pt did not attend group.    Cari Vandeberg, LRT/CTRS         Tavonte Seybold A 05/08/2019 12:24 PM 

## 2019-05-08 NOTE — BHH Suicide Risk Assessment (Signed)
[facility] INPATIENT:  Family/Significant Other Suicide Prevention Education  Suicide Prevention Education:  Education Completed; mother, Mary Carlson [phone]) has been identified by the patient as the family member/significant other with whom the patient will be residing, and identified as the person(s) who will aid the patient in the event of a mental health crisis (suicidal ideations/suicide attempt).  With written consent from the patient, the family member/significant other has been provided the following suicide prevention education, prior to the and/or following the discharge of the patient.  The suicide prevention education provided includes the following:  Suicide risk factors  Suicide prevention and interventions  National Suicide Hotline telephone number  [facility] assessment telephone number  [facility] Emergency Assistance [place] and/or Residential Mobile Crisis Unit telephone number  Request made of family/significant other to:  Remove weapons (e.g., guns, rifles, knives), all items previously/currently identified as safety concern.    Remove drugs/medications (over-the-counter, prescriptions, illicit drugs), all items previously/currently identified as a safety concern.  The family member/significant other verbalizes understanding of the suicide prevention education information provided.  The family member/significant other agrees to remove the items of safety concern listed above.  Mother has no safety concerns for the patient discharging home today. She confirms there are no guns or weapons in the home.   Mother shared prior to admission, she started finding new outpatient providers for this patient. She was able to schedule a psychiatrist appointment out in January and was made aware that [facility] staff was able to schedule sooner appointments for this patient.  Mother had no additional questions or concerns for CSW at this time. She will  pick up the patient after [place] 05/08/2019, 2:17 PM

## 2019-05-08 NOTE — BHH Suicide Risk Assessment (Signed)
[facility] Discharge Suicide Risk Assessment   Principal Problem: <principal problem not specified> Discharge Diagnoses: Active Problems:   Severe recurrent major depression without psychotic features ([place])   Total Time spent with patient: 45 minutes  Musculoskeletal: Strength & Muscle Tone: within normal limits Gait & Station: normal Patient leans: N/A  Psychiatric Specialty Exam: Review of Systems  All other systems reviewed and are negative.   Blood pressure 121/64, pulse 84, temperature 98.4 F (36.9 C), temperature source Oral, resp. rate 18, height 5\' 5"  (1.651 m), weight 114.3 kg, SpO2 96 %.Body mass index is 41.93 kg/m.  General Appearance: Casual  Eye Contact::  Good  Speech:  Normal Rate409  Volume:  Normal  Mood:  Euthymic  Affect:  Congruent  Thought Process:  Coherent and Descriptions of Associations: Intact  Orientation:  Full (Time, Place, and Person)  Thought Content:  Logical  Suicidal Thoughts:  No  Homicidal Thoughts:  No  Memory:  Immediate;   Good Recent;   Good Remote;   Good  Judgement:  Intact  Insight:  Fair  Psychomotor Activity:  Normal  Concentration:  Good  Recall:  Good  Fund of Knowledge:Good  Language: Good  Akathisia:  Negative  Handed:  Right  AIMS (if indicated):     Assets:  Desire for Improvement Housing Resilience  Sleep:  Number of Hours: 6  Cognition: WNL  ADL's:  Intact   Mental Status Per Nursing Assessment::   On Admission:  Suicidal ideation indicated by patient, Self-harm thoughts  Demographic Factors:  Adolescent or young adult and Unemployed  Loss Factors: Decrease in vocational status  Historical Factors: Impulsivity  Risk Reduction Factors:   Sense of responsibility to family, Living with another person, especially a relative and Positive coping skills or problem solving skills  Continued Clinical Symptoms:  Severe Anxiety and/or Agitation Depression:   Impulsivity  Cognitive Features That Contribute To  Risk:  None    Suicide Risk:  Minimal: No identifiable suicidal ideation.  Patients presenting with no risk factors but with morbid ruminations; may be classified as minimal risk based on the severity of the depressive symptoms  [place] Follow up.   Contact information: [address] [place] [address] [phone]           Plan Of Care/Follow-up recommendations:  Activity:  ad lib  Sharma Covert, MD 05/08/2019, 2:07 PM
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
## 2019-08-16 ENCOUNTER — Other Ambulatory Visit (HOSPITAL_COMMUNITY)
Admission: RE | Admit: 2019-08-16 | Discharge: 2019-08-16 | Disposition: A | Discharge status: Home / Self Care | Payer: BC Managed Care – PPO | Source: Ambulatory Visit | Attending: Physician Assistant | Admitting: Physician Assistant

## 2019-08-16 ENCOUNTER — Other Ambulatory Visit: Payer: Self-pay

## 2019-08-16 ENCOUNTER — Ambulatory Visit (INDEPENDENT_AMBULATORY_CARE_PROVIDER_SITE_OTHER): Payer: BC Managed Care – PPO | Admitting: Physician Assistant

## 2019-08-16 ENCOUNTER — Encounter: Payer: Self-pay | Admitting: Physician Assistant

## 2019-08-16 VITALS — BP 108/60 | HR 97 | Temp 98.6°F | Ht 65.0 in | Wt 261.5 lb

## 2019-08-16 DIAGNOSIS — R42 Dizziness and giddiness: Secondary | ICD-10-CM | POA: Diagnosis not present

## 2019-08-16 DIAGNOSIS — Z113 Encounter for screening for infections with a predominantly sexual mode of transmission: Secondary | ICD-10-CM | POA: Insufficient documentation

## 2019-08-16 DIAGNOSIS — F332 Major depressive disorder, recurrent severe without psychotic features: Secondary | ICD-10-CM | POA: Diagnosis not present

## 2019-08-16 LAB — CBC WITH DIFFERENTIAL/PLATELET
Basophils Absolute: 0 10*3/uL (ref 0.0–0.1)
Basophils Relative: 0.5 % (ref 0.0–3.0)
Eosinophils Absolute: 0.1 10*3/uL (ref 0.0–0.7)
Eosinophils Relative: 1.5 % (ref 0.0–5.0)
HCT: 43.3 % (ref 36.0–46.0)
Hemoglobin: 14.2 g/dL (ref 12.0–15.0)
Lymphocytes Relative: 24.1 % (ref 12.0–46.0)
Lymphs Abs: 1.8 10*3/uL (ref 0.7–4.0)
MCHC: 32.8 g/dL (ref 30.0–36.0)
MCV: 87.1 fl (ref 78.0–100.0)
Monocytes Absolute: 0.6 10*3/uL (ref 0.1–1.0)
Monocytes Relative: 8 % (ref 3.0–12.0)
Neutro Abs: 5 10*3/uL (ref 1.4–7.7)
Neutrophils Relative %: 65.9 % (ref 43.0–77.0)
Platelets: 239 10*3/uL (ref 150.0–400.0)
RBC: 4.96 Mil/uL (ref 3.87–5.11)
RDW: 14.6 % (ref 11.5–14.6)
WBC: 7.6 10*3/uL (ref 4.5–10.5)

## 2019-08-16 LAB — COMPREHENSIVE METABOLIC PANEL
ALT: 26 U/L (ref 0–35)
AST: 19 U/L (ref 0–37)
Albumin: 4.3 g/dL (ref 3.5–5.2)
Alkaline Phosphatase: 87 U/L (ref 39–117)
BUN: 9 mg/dL (ref 6–23)
CO2: 26 mEq/L (ref 19–32)
Calcium: 9.5 mg/dL (ref 8.4–10.5)
Chloride: 103 mEq/L (ref 96–112)
Creatinine, Ser: 0.71 mg/dL (ref 0.40–1.20)
GFR: 104.76 mL/min (ref >60.00)
Glucose, Bld: 111 mg/dL — ABNORMAL HIGH (ref 70–99)
Potassium: 4.3 mEq/L (ref 3.5–5.1)
Sodium: 136 mEq/L (ref 135–145)
Total Bilirubin: 0.4 mg/dL (ref 0.2–1.2)
Total Protein: 7.4 g/dL (ref 6.0–8.3)

## 2019-08-16 LAB — TSH: TSH: 2.04 u[IU]/mL (ref 0.35–5.50)

## 2019-08-16 LAB — POCT URINE PREGNANCY: Preg Test, Ur: NEGATIVE

## 2019-08-16 NOTE — Progress Notes (Signed)
Mary Carlson is a 19 y.o. female here for a follow up of a pre-existing problem.  I acted as a Neurosurgeon for [employer], PA-C Corky Mull, LPN  History of Present Illness:   Chief Complaint  Patient presents with  . Dizziness    HPI   Dizziness Pt c/o dizziness since last Saturday every time she moves her head. Feels like the room is spinning and gets nauseous. She was drinking a lot of ETOH on Saturday, had anywhere between 15 and 20 shots. Has recently increased medications Buspar to 15 mg BID (was on Buspar 10 mg BID) and Zoloft to 50 mg (was on Zoloft 25 mg) last Thursday, per Dr. Carie Caddy recommendation. Was seeing a therapist, but doesn't find it helpful. She is interested in going back to her original psych dosages but hasn't reached out to her psychiatrist about this yet. Currently denies SI. Does not have a plan. Denies: palpitations, chest pain, syncope, heavy periods.  Depression screen [facility] 2/9 08/16/2019 04/02/2019  Decreased Interest 2 3  Down, Depressed, Hopeless 1 2  PHQ - 2 Score 3 5  Altered sleeping 1 3  Tired, decreased energy 3 3  Change in appetite 1 3  Feeling bad or failure about yourself  3 3  Trouble concentrating 0 0  Moving slowly or fidgety/restless 0 0  Suicidal thoughts 1 0  PHQ-9 Score 12 17  Difficult doing work/chores Somewhat difficult Very difficult   STD concerns Wants to get checked today. Has a foul odor, but no discharge, thinks she might have chlamydia. Had unprotected sex last Friday. Has history of chlamydia. Denies treatment today. Denies pelvic pain, fever, chills.   Past Medical History:  Diagnosis Date  . Anxiety   . Depression   . History of chicken pox      Social History   Socioeconomic History  . Marital status: Single    Spouse name: Not on file  . Number of children: Not on file  . Years of education: Not on file  . Highest education level: Not on file  Occupational History  . Not on file  Tobacco Use  .  Smoking status: Current Every Day Smoker    Types: E-cigarettes  . Smokeless tobacco: Never Used  Substance and Sexual Activity  . Alcohol use: Yes    Alcohol/week: 15.0 standard drinks    Types: 15 Shots of liquor per week    Comment: 3 x's a week  . Drug use: Never  . Sexual activity: Yes    Birth control/protection: Implant  Other Topics Concern  . Not on file  Social History Narrative  . Not on file   Social Determinants of Health   Financial Resource Strain:   . Difficulty of Paying Living Expenses: Not on file  Food Insecurity:   . Worried About Programme researcher, broadcasting/film/video in the Last Year: Not on file  . Ran Out of Food in the Last Year: Not on file  Transportation Needs:   . Lack of Transportation (Medical): Not on file  . Lack of Transportation (Non-Medical): Not on file  Physical Activity:   . Days of Exercise per Week: Not on file  . Minutes of Exercise per Session: Not on file  Stress:   . Feeling of Stress : Not on file  Social Connections:   . Frequency of Communication with Friends and Family: Not on file  . Frequency of Social Gatherings with Friends and Family: Not on file  . Attends Religious  Services: Not on file  . Active Member of Clubs or Organizations: Not on file  . Attends Banker Meetings: Not on file  . Marital Status: Not on file  Intimate Partner Violence:   . Fear of Current or Ex-Partner: Not on file  . Emotionally Abused: Not on file  . Physically Abused: Not on file  . Sexually Abused: Not on file    Past Surgical History:  Procedure Laterality Date  . TONSILLECTOMY N/A 03/02/2019    History reviewed. No pertinent family history.  Allergies  Allergen Reactions  . Other Anaphylaxis    Walnuts & other tree nuts  . Watermelon [Citrullus Vulgaris] Other (See Comments)    Itchy throat  . Banana Rash    Current Medications:   Current Outpatient Medications:  .  busPIRone (BUSPAR) 15 MG tablet, Take 15 mg by mouth 2 (two)  times daily., Disp: , Rfl:  .  EPINEPHrine 0.3 mg/0.3 mL IJ SOAJ injection, Inject 0.3 mLs (0.3 mg total) into the muscle as needed for anaphylaxis., Disp: 2 each, Rfl: 0 .  etonogestrel (NEXPLANON) 68 MG IMPL implant, 68 mg by Subdermal route once., Disp: , Rfl:  .  sertraline (ZOLOFT) 50 MG tablet, Take 50 mg by mouth daily., Disp: , Rfl:    Review of Systems:   ROS Negative unless otherwise specified per HPI.  Vitals:   Vitals:   08/16/19 1104  BP: 108/60  Pulse: 97  Temp: 98.6 F (37 C)  TempSrc: Temporal  SpO2: 95%  Weight: 261 lb 8 oz (118.6 kg)  Height: 5\' 5"  (1.651 m)     Body mass index is 43.52 kg/m.  Physical Exam:   Physical Exam Vitals and nursing note reviewed.  Constitutional:      General: She is not in acute distress.    Appearance: She is well-developed. She is not ill-appearing or toxic-appearing.  Cardiovascular:     Rate and Rhythm: Normal rate and regular rhythm.     Pulses: Normal pulses.     Heart sounds: Normal heart sounds, S1 normal and S2 normal.     Comments: No LE edema Pulmonary:     Effort: Pulmonary effort is normal.     Breath sounds: Normal breath sounds.  Genitourinary:    Comments: Declined GU eval Skin:    General: Skin is warm and dry.  Neurological:     General: No focal deficit present.     Mental Status: She is alert.     GCS: GCS eye subscore is 4. GCS verbal subscore is 5. GCS motor subscore is 6.     Cranial Nerves: Cranial nerves are intact.     Sensory: Sensation is intact.     Motor: Motor function is intact.     Coordination: Coordination is intact.     Comments: Declined Dix-Hallpike  Psychiatric:        Attention and Perception: Attention normal.        Mood and Affect: Affect is flat.        Speech: Speech normal.        Behavior: Behavior normal. Behavior is cooperative.     Results for orders placed or performed in visit on 08/16/19  POCT urine pregnancy  Result Value Ref Range   Preg Test, Ur  Negative Negative    Assessment and Plan:   Gavriella was seen today for dizziness.  Diagnoses and all orders for this visit:  Screening examination for STD (sexually transmitted disease) She declined  empiric treatment today. Recommended abstinence from sex until results have returned. Orders for testing placed today. -     HIV Antibody (routine testing w rflx) -     RPR -     Urine cytology ancillary only([place]) -     POCT urine pregnancy  Severe recurrent major depression without psychotic features ([place]) Uncontrolled. I discussed with patient that if they develop any SI, to tell someone immediately and seek medical attention. I have given her Dr. Starleen Arms information to call and discuss her medication concerns with her, given she is the prescriber of these medications. -     CBC with Differential/Platelet  Vertigo When I described the Dix-Hallpike test to her, she refused because she thought it would cause very unpleasant symptoms. I offered Vestibular Rehab as well as Meclizine but she declined both of these. I did provide her with a copy of the Epley Maneuvers for her to try on her own. I recommended that she decrease her alcohol intake significantly if not completely. Will also update CMP and CBC, TSH to make sure no other etiologies of her symptoms. -     Comprehensive metabolic panel -     TSH  . Reviewed expectations re: course of current medical issues. . Discussed self-management of symptoms. . Outlined signs and symptoms indicating need for more acute intervention. . Patient verbalized understanding and all questions were answered. . See orders for this visit as documented in the electronic medical record. . Patient received an After-Visit Summary.  CMA or LPN served as scribe during this visit. History, Physical, and Plan performed by medical provider. The above documentation has been reviewed and is accurate and complete.   Inda Coke, PA-C

## 2019-08-16 NOTE — Patient Instructions (Signed)
It was great to see you!  Please call Dr. Carie Caddy office at [phone] to explain your medication concerns  Please abstain from sexual intercourse until your lab results have returned  Please go to the ER if you develop suicidal thoughts  Please let me know if you want treatment for your vertigo  Take care,  Jarold Motto PA-C

## 2019-08-19 LAB — HIV ANTIBODY (ROUTINE TESTING W REFLEX): HIV 1&2 Ab, 4th Generation: NONREACTIVE

## 2019-08-19 LAB — RPR: RPR Ser Ql: NONREACTIVE

## 2019-08-21 ENCOUNTER — Other Ambulatory Visit: Payer: Self-pay | Admitting: Physician Assistant

## 2019-08-21 ENCOUNTER — Telehealth: Payer: Self-pay | Admitting: Physician Assistant

## 2019-08-21 MED ORDER — AZITHROMYCIN 250 MG PO TABS: ORAL_TABLET | ORAL | 0 refills | Status: DC

## 2019-08-21 MED ORDER — AZITHROMYCIN 250 MG PO TABS: 1000.0000 mg | ORAL_TABLET | Freq: Once | ORAL | 0 refills | Status: AC

## 2019-08-21 NOTE — Telephone Encounter (Signed)
Patient called in returning Donna's call about lab results, asked for a returned call.

## 2019-08-21 NOTE — Telephone Encounter (Signed)
See result note.  

## 2019-08-22 ENCOUNTER — Encounter: Payer: Self-pay | Admitting: Physician Assistant

## 2019-08-22 MED ORDER — AZITHROMYCIN 250 MG PO TABS: ORAL_TABLET | ORAL | 0 refills | Status: DC

## 2019-08-22 NOTE — Telephone Encounter (Signed)
Left pt message on voicemail Rx was sent to pharmacy.

## 2019-08-23 LAB — URINE CYTOLOGY ANCILLARY ONLY
Chlamydia: POSITIVE — AB
Comment: NEGATIVE
Comment: NEGATIVE
Comment: NORMAL
Neisseria Gonorrhea: NEGATIVE
Trichomonas: NEGATIVE

## 2019-12-27 ENCOUNTER — Encounter (HOSPITAL_COMMUNITY): Payer: Self-pay | Admitting: Psychiatry

## 2019-12-27 ENCOUNTER — Ambulatory Visit (HOSPITAL_COMMUNITY)
Admission: AD | Admit: 2019-12-27 | Discharge: 2019-12-27 | Disposition: A | Discharge status: Other Acute Inpatient Hospital | Payer: BC Managed Care – PPO | Attending: Psychiatry | Admitting: Psychiatry

## 2019-12-27 ENCOUNTER — Other Ambulatory Visit: Payer: Self-pay

## 2019-12-27 ENCOUNTER — Inpatient Hospital Stay (HOSPITAL_COMMUNITY)
Admission: EM | Admit: 2019-12-27 | Discharge: 2019-12-30 | DRG: 885 | Disposition: A | Discharge status: Home / Self Care | Payer: BC Managed Care – PPO | Source: Intra-hospital | Attending: Psychiatry | Admitting: Psychiatry

## 2019-12-27 DIAGNOSIS — R45851 Suicidal ideations: Secondary | ICD-10-CM | POA: Insufficient documentation

## 2019-12-27 DIAGNOSIS — Z20822 Contact with and (suspected) exposure to covid-19: Secondary | ICD-10-CM | POA: Diagnosis not present

## 2019-12-27 DIAGNOSIS — G4700 Insomnia, unspecified: Secondary | ICD-10-CM | POA: Diagnosis present

## 2019-12-27 DIAGNOSIS — F329 Major depressive disorder, single episode, unspecified: Secondary | ICD-10-CM | POA: Diagnosis present

## 2019-12-27 DIAGNOSIS — F17290 Nicotine dependence, other tobacco product, uncomplicated: Secondary | ICD-10-CM | POA: Diagnosis present

## 2019-12-27 DIAGNOSIS — F419 Anxiety disorder, unspecified: Secondary | ICD-10-CM | POA: Diagnosis not present

## 2019-12-27 DIAGNOSIS — Z915 Personal history of self-harm: Secondary | ICD-10-CM | POA: Diagnosis not present

## 2019-12-27 DIAGNOSIS — F332 Major depressive disorder, recurrent severe without psychotic features: Secondary | ICD-10-CM | POA: Diagnosis present

## 2019-12-27 DIAGNOSIS — F411 Generalized anxiety disorder: Secondary | ICD-10-CM | POA: Diagnosis present

## 2019-12-27 DIAGNOSIS — F1020 Alcohol dependence, uncomplicated: Secondary | ICD-10-CM | POA: Diagnosis not present

## 2019-12-27 MED ORDER — HYDROXYZINE HCL 25 MG PO TABS: 25.0000 mg | ORAL_TABLET | Freq: Four times a day (QID) | ORAL | Status: DC | PRN

## 2019-12-27 MED ORDER — MAGNESIUM HYDROXIDE 400 MG/5ML PO SUSP
30.0000 mL | Freq: Every day | ORAL | Status: DC | PRN
  Administered 2019-12-29: 30 mL via ORAL

## 2019-12-27 MED ORDER — ACETAMINOPHEN 325 MG PO TABS
650.0000 mg | ORAL_TABLET | Freq: Four times a day (QID) | ORAL | Status: DC | PRN
  Administered 2019-12-27: 650 mg via ORAL

## 2019-12-27 MED ORDER — BUSPIRONE HCL 10 MG PO TABS
10.0000 mg | ORAL_TABLET | Freq: Three times a day (TID) | ORAL | Status: DC
  Filled 2019-12-27 (×8): qty 1

## 2019-12-27 MED ORDER — ALUM & MAG HYDROXIDE-SIMETH 200-200-20 MG/5ML PO SUSP: 30.0000 mL | ORAL | Status: DC | PRN

## 2019-12-27 MED ORDER — TRAZODONE HCL 50 MG PO TABS
50.0000 mg | ORAL_TABLET | Freq: Every evening | ORAL | Status: DC | PRN
  Filled 2019-12-27 (×2): qty 1

## 2019-12-27 MED ORDER — MAGNESIUM HYDROXIDE 400 MG/5ML PO SUSP: 30.0000 mL | Freq: Every day | ORAL | Status: DC | PRN

## 2019-12-27 MED ORDER — ACETAMINOPHEN 325 MG PO TABS: 650.0000 mg | ORAL_TABLET | Freq: Four times a day (QID) | ORAL | Status: DC | PRN
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
## 2019-12-27 NOTE — ED Notes (Signed)
Transported at [facility] via [facility].
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
## 2019-12-27 NOTE — ED Notes (Signed)
Safe transport was called at 1610 hrs.

## 2019-12-27 NOTE — ED Provider Notes (Signed)
Behavioral Health Medical Screening Exam  Mary Carlson is a 19 y.o. female.  Patient presents voluntarily to [facility] accompanied by parents for walk-in assessment. Patient alert and oriented, answers appropriately.  Patient request that parents, Amy and Dallas Breeding, remain present during assessment. Patient states "I have been feeling like I do not belong here like I am a disappointment to everybody and I do not know what to do with my life for 2 weeks." Patient reports history of anxiety and depression.  Patient reports she is seen outpatient by Dr. Toy Care.  Patient reports compliance with home medications including BuSpar 10 mg daily and sertraline 25 mg daily. Patient endorses multiple stressors.  Patient reports that "my roommate is so rude to me, I spent all day cleaning yesterday but I did not do anything right."  Patient also reports "my student loans are coming up and I am paying $400 a month, I have to go back to school because I owe." Patient currently endorses suicidal ideations with a plan to "overdose and slit my wrist at the same time."  Patient reports history of suicidal ideations, denies history of suicide attempt.  Patient reports history of self-harm behaviors, states "it has been a long time, I cannot remember the last time." Patient denies homicidal ideations.  Patient denies auditory visual hallucinations.  Patient denies symptoms of paranoia. Patient reports she currently resides with roommates however patient and parents report plan for patient to return to parents home.  Patient denies access to weapons.  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 Patient endorses alcohol use states "we drink all the time every weekend."  Currently patient is not interested and substance use treatment resources, patient states "I do not want to stop."  Patient denies substance use aside from alcohol.  No symptoms of alcohol withdrawal noted  currently.  Patient reports she is interested in discontinuing use of nicotine by vaping.  Total Time spent with patient: 30 minutes  Psychiatric Specialty Exam  Presentation  General Appearance:Appropriate for Environment  Eye Contact:Minimal  Speech:Clear and Coherent;Normal Rate  Speech Volume:Normal  Handedness:Right   Mood and Affect  Mood:Depressed  Affect:Depressed;Tearful   Thought Process  Thought Processes:Coherent;Goal Directed  Descriptions of Associations:Intact  Orientation:Full (Time, Place and Person)  Thought Content:Logical  Hallucinations:None  Ideas of Reference:None  Suicidal Thoughts:Yes, Active With Intent;With Plan;Without Means to Carry Out  Homicidal Thoughts:No   Sensorium  Memory:Immediate Good;Recent Good;Remote Good  Judgment:Fair  Insight:Fair   Executive Functions  Concentration:Good  Attention Span:Good  [place]  Language:Good   Psychomotor Activity  Psychomotor Activity:Normal   Assets  Assets:Communication Skills;Desire for Improvement;Financial Resources/Insurance;Housing;Intimacy;Leisure Time;Physical Health;Resilience;Social Support;Talents/Skills;Transportation;Vocational/Educational   Sleep  Sleep:Fair  Number of hours: No data recorded  Physical Exam: Physical Exam Vitals and nursing note reviewed.  Constitutional:      Appearance: She is well-developed.  HENT:     Head: Normocephalic.  Cardiovascular:     Rate and Rhythm: Normal rate.  Pulmonary:     Effort: Pulmonary effort is normal.  Neurological:     Mental Status: She is alert and oriented to person, place, and time.  Psychiatric:        Attention and Perception: Attention and perception normal.        Mood and Affect: Mood normal. Affect is tearful.        Speech: Speech normal.        Behavior: Behavior normal. Behavior is cooperative.  Thought Content: Thought content includes suicidal  ideation. Thought content includes suicidal plan.        Cognition and Memory: Cognition normal.        Judgment: Judgment normal.    Review of Systems  Constitutional: Negative.   HENT: Negative.   Eyes: Negative.   Respiratory: Negative.   Cardiovascular: Negative.   Gastrointestinal: Negative.   Genitourinary: Negative.   Musculoskeletal: Negative.   Skin: Negative.   Neurological: Negative.   Endo/Heme/Allergies: Negative.   Psychiatric/Behavioral: Positive for suicidal ideas.   Blood pressure 125/65, pulse 83, temperature 98 F (36.7 C), temperature source Temporal, resp. rate 16, height 5\' 5"  (1.651 m), weight 266 lb (120.7 kg), SpO2 99 %. Body mass index is 44.26 kg/m.  Musculoskeletal: Strength & Muscle Tone: within normal limits Gait & Station: normal Patient leans: N/A   Recommendations: Patient discussed Dr. . Inpatient psychiatric treatment recommended.  Based on my evaluation the patient does not appear to have an emergency medical condition.  Lucianne Muss, FNP 12/27/2019, 2:35 PM

## 2019-12-28 DIAGNOSIS — F411 Generalized anxiety disorder: Secondary | ICD-10-CM

## 2019-12-28 DIAGNOSIS — F332 Major depressive disorder, recurrent severe without psychotic features: Principal | ICD-10-CM

## 2019-12-28 DIAGNOSIS — F1020 Alcohol dependence, uncomplicated: Secondary | ICD-10-CM

## 2019-12-28 LAB — COMPREHENSIVE METABOLIC PANEL
ALT: 33 U/L (ref 0–44)
AST: 29 U/L (ref 15–41)
Albumin: 4.3 g/dL (ref 3.5–5.0)
Alkaline Phosphatase: 70 U/L (ref 38–126)
Anion gap: 10 (ref 5–15)
BUN: 12 mg/dL (ref 6–20)
CO2: 21 mmol/L — ABNORMAL LOW (ref 22–32)
Calcium: 9.4 mg/dL (ref 8.9–10.3)
Chloride: 105 mmol/L (ref 98–111)
Creatinine, Ser: 0.85 mg/dL (ref 0.44–1.00)
GFR calc Af Amer: >60 mL/min (ref >60)
GFR calc non Af Amer: >60 mL/min (ref >60)
Glucose, Bld: 116 mg/dL — ABNORMAL HIGH (ref 70–99)
Potassium: 4.3 mmol/L (ref 3.5–5.1)
Sodium: 136 mmol/L (ref 135–145)
Total Bilirubin: 0.3 mg/dL (ref 0.3–1.2)
Total Protein: 8.6 g/dL — ABNORMAL HIGH (ref 6.5–8.1)

## 2019-12-28 LAB — CBC WITH DIFFERENTIAL/PLATELET
Abs Immature Granulocytes: 0.03 10*3/uL (ref 0.00–0.07)
Basophils Absolute: 0 10*3/uL (ref 0.0–0.1)
Basophils Relative: 0 %
Eosinophils Absolute: 0.1 10*3/uL (ref 0.0–0.5)
Eosinophils Relative: 1 %
HCT: 46.2 % — ABNORMAL HIGH (ref 36.0–46.0)
Hemoglobin: 14.6 g/dL (ref 12.0–15.0)
Immature Granulocytes: 0 %
Lymphocytes Relative: 27 %
Lymphs Abs: 2.4 10*3/uL (ref 0.7–4.0)
MCH: 28.9 pg (ref 26.0–34.0)
MCHC: 31.6 g/dL (ref 30.0–36.0)
MCV: 91.3 fL (ref 80.0–100.0)
Monocytes Absolute: 0.6 10*3/uL (ref 0.1–1.0)
Monocytes Relative: 7 %
Neutro Abs: 5.6 10*3/uL (ref 1.7–7.7)
Neutrophils Relative %: 65 %
Platelets: 262 10*3/uL (ref 150–400)
RBC: 5.06 MIL/uL (ref 3.87–5.11)
RDW: 14.8 % (ref 11.5–15.5)
WBC: 8.7 10*3/uL (ref 4.0–10.5)
nRBC: 0 % (ref 0.0–0.2)

## 2019-12-28 LAB — RAPID URINE DRUG SCREEN, HOSP PERFORMED
Amphetamines: NOT DETECTED
Barbiturates: NOT DETECTED
Benzodiazepines: NOT DETECTED
Cocaine: NOT DETECTED
Opiates: NOT DETECTED
Tetrahydrocannabinol: NOT DETECTED

## 2019-12-28 LAB — HCG, QUANTITATIVE, PREGNANCY: hCG, Beta Chain, Quant, S: <1 m[IU]/mL (ref <5)

## 2019-12-28 MED ORDER — DULOXETINE HCL 30 MG PO CPEP
30.0000 mg | ORAL_CAPSULE | Freq: Every day | ORAL | Status: DC
  Administered 2019-12-28 – 2019-12-30 (×3): 30 mg via ORAL
  Filled 2019-12-28 (×6): qty 1

## 2019-12-28 NOTE — H&P (Signed)
Psychiatric Admission Assessment Adult  Patient Identification: Mary Carlson MRN:  [id number] Date of Evaluation:  12/28/2019 Chief Complaint:  MDD (major depressive disorder), recurrent episode, severe (HCC) [F33.2] Principal Diagnosis: <principal problem not specified> Diagnosis:  Active Problems:   MDD (major depressive disorder), recurrent episode, severe (HCC)  History of Present Illness: Patient is seen and examined.  Patient is a 19 year old female with a past psychiatric history significant for major depression as well as generalized anxiety disorder who presented to the behavioral health urgent care center on 12/25/2019 with suicidal ideation.  She presented there with her parents.  She stated that she has been treated psychiatrically as an outpatient with Dr. Evelene Croon since her last psychiatric hospitalization at our facility on 05/06/2019.  At that time she had been discharged on buspirone, sertraline and trazodone.  After discharge she had followed up with Dr. Evelene Croon and had been in therapy.  She stated that the therapy was not helpful, and she stopped going.  Previously they had attempted to increase her sertraline dose to 75 mg p.o. daily, but the patient became "dizzy".  That was reduced back down to 50 mg p.o. daily.  He stated that most recently he had felt more suicidal.  She stated been going on for 2 to 3 months.  She stated that she was having problems with her roommate, and other psychosocial issues.  This upset her greatly.  She stated that things got so bad that yesterday she was developing a plan to kill herself.  She admitted to compliance with her medications, but also admitted to drinking alcohol excessively.  She decided to go to the behavioral health urgent care center for assistance.  She admitted there that she was helpless, hopeless and worthless as well as suicidal.    Her last psychiatric hospitalization was on 05/06/2019 at that time she had planned on overdosing on pills and  Clorox.  Her father came into the room and found her to be suicidal.  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  She admitted at that time that she had been taking psychiatric medication since middle school.  On her admission at that time she was on Escitalopram.  She was admitted to the hospital for evaluation and stabilization. Associated Signs/Symptoms: Depression Symptoms:  depressed mood, anhedonia, insomnia, psychomotor agitation, fatigue, feelings of worthlessness/guilt, difficulty concentrating, hopelessness, suicidal thoughts without plan, anxiety, loss of energy/fatigue, disturbed sleep, (Hypo) Manic Symptoms:  Impulsivity, Irritable Mood, Labiality of Mood, Anxiety Symptoms:  Excessive Worry, Psychotic Symptoms:  Denied PTSD Symptoms: Negative Total Time spent with patient: 45 minutes  Past Psychiatric History: Patient reported having been treated for depression anxiety for years.  She denied previous psychiatric hospitalization or suicide attempts.  She stated she had been treated in therapy and with medications since adolescence.  It is known that she has been previously treated with Escitalopram, sertraline, buspirone and trazodone.  Is the patient at risk to self? Yes.    Has the patient been a risk to self in the past 6 months? Yes.    Has the patient been a risk to self within the distant past? No.  Is the patient a risk to others? No.  Has the patient been a risk to others in the past 6 months? No.  Has the patient been a risk to others within the distant past? No.   Prior Inpatient Therapy:   Prior Outpatient Therapy:    Alcohol Screening:  Substance Abuse History in the last 12 months:  Yes.   Consequences of Substance Abuse: Negative Previous Psychotropic Medications: Yes  Psychological Evaluations: Yes  Past Medical History:  Past Medical History:  Diagnosis Date  . Anxiety   . Depression   .  History of chicken pox     Past Surgical History:  Procedure Laterality Date  . TONSILLECTOMY N/A 03/02/2019   Family History: History reviewed. No pertinent family history. Family Psychiatric  History: Denied Tobacco Screening:   Social History:  Social History   Substance and Sexual Activity  Alcohol Use Yes  . Alcohol/week: 15.0 standard drinks  . Types: 15 Shots of liquor per week   Comment: 3 x's a week     Social History   Substance and Sexual Activity  Drug Use Never    Additional Social History:                           Allergies:   Allergies  Allergen Reactions  . Food Itching    Walnuts   . Other Anaphylaxis    Walnuts & other tree nuts  . Watermelon [Citrullus Vulgaris] Other (See Comments)    Itchy throat  . Banana Rash   Lab Results: No results found for this or any previous visit (from the past 48 hour(s)).  Blood Alcohol level:  Lab Results  Component Value Date   ETH <10 05/06/2019    Metabolic Disorder Labs:  Lab Results  Component Value Date   HGBA1C 6.1 (H) 05/08/2019   MPG 128.37 05/08/2019   No results found for: PROLACTIN Lab Results  Component Value Date   CHOL 175 05/08/2019   TRIG 132 05/08/2019   HDL 62 05/08/2019   CHOLHDL 2.8 05/08/2019   VLDL 26 05/08/2019   LDLCALC 87 05/08/2019    Current Medications: Current Facility-Administered Medications  Medication Dose Route Frequency Provider Last Rate Last Admin  . acetaminophen (TYLENOL) tablet 650 mg  650 mg Oral Q6H PRN Fransisca Kaufmann A, NP   650 mg at 12/27/19 1833  . alum & mag hydroxide-simeth (MAALOX/MYLANTA) 200-200-20 MG/5ML suspension 30 mL  30 mL Oral Q4H PRN Fransisca Kaufmann A, NP      . busPIRone (BUSPAR) tablet 10 mg  10 mg Oral TID Antonieta Pert, MD      . DULoxetine (CYMBALTA) DR capsule 30 mg  30 mg Oral Daily Antonieta Pert, MD   30 mg at 12/28/19 7591  . hydrOXYzine (ATARAX/VISTARIL) tablet 25 mg  25 mg Oral Q6H PRN Antonieta Pert, MD       . magnesium hydroxide (MILK OF MAGNESIA) suspension 30 mL  30 mL Oral Daily PRN Thermon Leyland, NP      . traZODone (DESYREL) tablet 50 mg  50 mg Oral QHS PRN Thermon Leyland, NP       PTA Medications: Medications Prior to Admission  Medication Sig Dispense Refill Last Dose  . busPIRone (BUSPAR) 10 MG tablet Take 10 mg by mouth 2 (two) times daily.     Marland Kitchen EPINEPHrine 0.3 mg/0.3 mL IJ SOAJ injection Inject 0.3 mLs (0.3 mg total) into the muscle as needed for anaphylaxis. 2 each 0   . sertraline (ZOLOFT) 25 MG tablet Take 25 mg by mouth daily.       Musculoskeletal: Strength & Muscle Tone: within normal limits Gait & Station: normal Patient leans: N/A  Psychiatric Specialty Exam: Physical Exam Vitals and nursing note  reviewed.  Constitutional:      Appearance: Normal appearance.  HENT:     Head: Normocephalic.  Pulmonary:     Effort: Pulmonary effort is normal.  Neurological:     General: No focal deficit present.     Mental Status: She is alert and oriented to person, place, and time.     Review of Systems  Blood pressure 130/67, pulse 79, temperature 98.1 F (36.7 C), temperature source Oral, resp. rate 16, height 5\' 5"  (1.651 m), weight 113.4 kg, SpO2 97 %.Body mass index is 41.6 kg/m.  General Appearance: Casual  Eye Contact:  Fair  Speech:  Normal Rate  Volume:  Decreased  Mood:  Anxious and Depressed  Affect:  Congruent  Thought Process:  Coherent and Descriptions of Associations: Intact  Orientation:  Full (Time, Place, and Person)  Thought Content:  Logical  Suicidal Thoughts:  Yes.  without intent/plan  Homicidal Thoughts:  No  Memory:  Immediate;   Fair Recent;   Fair Remote;   Fair  Judgement:  Intact  Insight:  Fair  Psychomotor Activity:  Normal  Concentration:  Concentration: Good and Attention Span: Good  Recall:  Good  Fund of Knowledge:  Good  Language:  Good  Akathisia:  Negative  Handed:  Right  AIMS (if indicated):     Assets:  Desire for  Improvement Housing Resilience Social Support  ADL's:  Intact  Cognition:  WNL  Sleep:       Treatment Plan Summary: Daily contact with patient to assess and evaluate symptoms and progress in treatment, Medication management and Plan : Patient is seen and examined.  Patient is a 19 year old female with the above-stated past psychiatric history who was admitted after worsening depression and anxiety and suicidal ideation.  She will be admitted to the hospital.  She will be integrated in the milieu.  She will be encouraged to attend groups.  She has previously failed Escitalopram as well as sertraline.  We will start her on duloxetine 30 mg p.o. daily and titrate that during the course of hospitalization.  We will continue her buspirone at 10 mg p.o. 3 times daily.  This to be titrated during the course of hospitalization.  We have discussed cessation of alcohol.  She understands that alcohol is a depressant and is not adding to improve function.  I have also encouraged her after discharge to get back involved in psychotherapy.  Unfortunately we do not have a laboratory work on her currently.  Also no laboratories have been ordered for the patient.  We will take care of that today.  Observation Level/Precautions:  15 minute checks  Laboratory:  Chemistry Profile  Psychotherapy:    Medications:    Consultations:    Discharge Concerns:    Estimated LOS:  Other:     Physician Treatment Plan for Primary Diagnosis: <principal problem not specified> Long Term Goal(s): Improvement in symptoms so as ready for discharge  Short Term Goals: Ability to identify changes in lifestyle to reduce recurrence of condition will improve, Ability to verbalize feelings will improve, Ability to disclose and discuss suicidal ideas, Ability to demonstrate self-control will improve, Ability to identify and develop effective coping behaviors will improve, Ability to maintain clinical measurements within normal limits will  improve and Ability to identify triggers associated with substance abuse/mental health issues will improve  Physician Treatment Plan for Secondary Diagnosis: Active Problems:   MDD (major depressive disorder), recurrent episode, severe (HCC)  Long Term Goal(s): Improvement in symptoms  so as ready for discharge  Short Term Goals: Ability to identify changes in lifestyle to reduce recurrence of condition will improve, Ability to verbalize feelings will improve, Ability to disclose and discuss suicidal ideas, Ability to demonstrate self-control will improve, Ability to identify and develop effective coping behaviors will improve, Ability to maintain clinical measurements within normal limits will improve and Ability to identify triggers associated with substance abuse/mental health issues will improve  I certify that inpatient services furnished can reasonably be expected to improve the patient's condition.    Sharma Covert, MD 6/26/202112:40 PM

## 2019-12-28 NOTE — Progress Notes (Signed)
   12/28/19 0000  Psych Admission Type (Psych Patients Only)  Admission Status Voluntary  Psychosocial Assessment  Patient Complaints Anxiety  Eye Contact Fair  Facial Expression Anxious  Affect Appropriate to circumstance  Speech Logical/coherent  Interaction Forwards little  Motor Activity Other (Comment) (wnl)  Appearance/Hygiene Unremarkable  Behavior Characteristics Cooperative;Anxious  Mood Anxious  Thought Process  Coherency WDL  Content WDL  Delusions None reported or observed  Perception WDL  Hallucination None reported or observed  Judgment Impaired  Confusion None  Danger to Self  Current suicidal ideation? Denies  Danger to Others  Danger to Others None reported or observed  D: Patient in room sleeping all evening. Pt appears irritable and did not want to participate in group activities.  A: 15 mins checks for safety.  R: Patient remains safe on the unit. Will continue to monitor for safety and stability.
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
## 2019-12-28 NOTE — Progress Notes (Signed)
D: Pt reports increased anxiety this morning. She denies feeling depressed today. She denies SI/HI. She denies AVH. She refused to take the Buspar this morning because she feels that it is not working for her anxiety. She reports fair sleep at bedtime. A:Orders reviewed with the pt. V/s assessed. Verbal support provided. 15 minute checks performed for safety.  R: Pt compliant with tx plan and denies having any side effects.     12/28/19 1000  Psych Admission Type (Psych Patients Only)  Admission Status Voluntary  Psychosocial Assessment  Patient Complaints Anxiety  Eye Contact Fair  Facial Expression Anxious  Affect Appropriate to circumstance  Speech Logical/coherent  Interaction Forwards little  Motor Activity Slow  Appearance/Hygiene Unremarkable  Behavior Characteristics Appropriate to situation  Mood Anxious  Aggressive Behavior  Effect No apparent injury  Thought Process  Coherency WDL  Content WDL  Delusions None reported or observed  Perception WDL  Hallucination None reported or observed  Judgment Impaired  Confusion None  Danger to Self  Current suicidal ideation? Denies  Danger to Others  Danger to Others None reported or observed

## 2019-12-28 NOTE — Progress Notes (Signed)
Urine specimen cup provided to the pt. Pt education provided.
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
## 2019-12-28 NOTE — Progress Notes (Signed)
   12/28/19 2131  COVID-19 Daily Checkoff  Have you had a fever (temp > 37.80C/100F)  in the past 24 hours?  No  If you have had runny nose, nasal congestion, sneezing in the past 24 hours, has it worsened? No  COVID-19 EXPOSURE  Have you traveled outside the state in the past 14 days? No  Have you been in contact with someone with a confirmed diagnosis of COVID-19 or PUI in the past 14 days without wearing appropriate PPE? No  Have you been living in the same home as a person with confirmed diagnosis of COVID-19 or a PUI (household contact)? No  Have you been diagnosed with COVID-19? No

## 2019-12-28 NOTE — Progress Notes (Signed)
   12/28/19 2133  Psych Admission Type (Psych Patients Only)  Admission Status Voluntary  Psychosocial Assessment  Patient Complaints None  Eye Contact Fair  Facial Expression Flat  Affect Appropriate to circumstance  Speech Logical/coherent  Interaction Assertive  Motor Activity Other (Comment) (WDL)  Appearance/Hygiene Unremarkable  Behavior Characteristics Appropriate to situation  Mood Pleasant  Thought Process  Coherency WDL  Content WDL  Delusions None reported or observed  Perception WDL  Hallucination None reported or observed  Judgment Impaired  Confusion None  Danger to Self  Current suicidal ideation? Denies  Danger to Others  Danger to Others None reported or observed

## 2019-12-28 NOTE — BHH Group Notes (Signed)
Adult Psychoeducational Group Note  Date:  12/28/2019 Time:  10:58 AM  Group Topic/Focus:  Goals Group:   The focus of this group is to help patients establish daily goals to achieve during treatment and discuss how the patient can incorporate goal setting into their daily lives to aide in recovery.  Participation Level:  Active  Participation Quality:  Appropriate  Affect:  Flat  Cognitive:  Appropriate  Insight: Appropriate  Engagement in Group:  Engaged  Modes of Intervention:  Discussion, Education and Exploration  Additional Comments:  Was able to set a goal of not rethinking her present situation and redirecting herself by staying in the present.   Dione Housekeeper 12/28/2019, 10:58 AM

## 2019-12-28 NOTE — Progress Notes (Signed)
Adult Psychoeducational Group Note  Date:  12/28/2019 Time:  9:18 PM  Group Topic/Focus:  Wrap-Up Group:   The focus of this group is to help patients review their daily goal of treatment and discuss progress on daily workbooks.  Participation Level:  Active  Participation Quality:  Appropriate  Affect:  Excited  Cognitive:  Alert  Insight: Good  Engagement in Group:  Engaged  Modes of Intervention:  Education  Additional Comments:  She is excited with the new Medication she began taking. She hope it affect her positively.  Toniya Rozar  Lanice Shirts 12/28/2019, 9:18 PM

## 2019-12-29 LAB — RAPID HIV SCREEN (HIV 1/2 AB+AG)
HIV 1/2 Antibodies: NONREACTIVE
HIV-1 P24 Antigen - HIV24: NONREACTIVE

## 2019-12-29 LAB — HIV ANTIBODY (ROUTINE TESTING W REFLEX): HIV Screen 4th Generation wRfx: NONREACTIVE

## 2019-12-29 NOTE — Progress Notes (Signed)
   12/29/19 1100  Psych Admission Type (Psych Patients Only)  Admission Status Voluntary  Psychosocial Assessment  Patient Complaints Anxiety  Eye Contact Fair  Facial Expression Flat  Affect Appropriate to circumstance  Speech Logical/coherent  Interaction Assertive  Motor Activity Fidgety  Appearance/Hygiene Unremarkable  Behavior Characteristics Appropriate to situation  Mood Anxious  Aggressive Behavior  Effect No apparent injury  Thought Process  Coherency WDL  Content WDL  Delusions None reported or observed  Perception WDL  Hallucination None reported or observed  Judgment Impaired  Confusion None  Danger to Self  Current suicidal ideation? Denies  Danger to Others  Danger to Others None reported or observed

## 2019-12-29 NOTE — Progress Notes (Signed)
Adult Psychoeducational Group Note  Date:  12/29/2019 Time:  11:00 PM  Group Topic/Focus:  Wrap-Up Group:   The focus of this group is to help patients review their daily goal of treatment and discuss progress on daily workbooks.  Participation Level:  Did Not Attend  Participation Quality:  Did not attend  Affect:  Did not attend  Cognitive:  Did not attend  Insight: None  Engagement in Group:  Did not attend  Modes of Intervention:  Did not attend  Additional Comments:  Patient did not attend.  Mary Carlson  Lanice Shirts 12/29/2019, 11:00 PM

## 2019-12-29 NOTE — Progress Notes (Signed)
   12/29/19 2114  Psych Admission Type (Psych Patients Only)  Admission Status Voluntary  Psychosocial Assessment  Patient Complaints None  Eye Contact Brief  Facial Expression Flat  Affect Appropriate to circumstance  Speech Logical/coherent  Interaction Assertive  Motor Activity Other (Comment) (WDL)  Appearance/Hygiene Unremarkable  Behavior Characteristics Appropriate to situation  Mood Anxious  Thought Process  Coherency WDL  Content WDL  Delusions None reported or observed  Perception WDL  Hallucination None reported or observed  Judgment Impaired  Confusion None  Danger to Self  Current suicidal ideation? Denies  Danger to Others  Danger to Others None reported or observed

## 2019-12-29 NOTE — Progress Notes (Signed)
°   12/29/19 2112  COVID-19 Daily Checkoff  Have you had a fever (temp > 37.80C/100F)  in the past 24 hours?  No  If you have had runny nose, nasal congestion, sneezing in the past 24 hours, has it worsened? No  COVID-19 EXPOSURE  Have you traveled outside the state in the past 14 days? No  Have you been in contact with someone with a confirmed diagnosis of COVID-19 or PUI in the past 14 days without wearing appropriate PPE? No  Have you been living in the same home as a person with confirmed diagnosis of COVID-19 or a PUI (household contact)? No  Have you been diagnosed with COVID-19? No

## 2019-12-29 NOTE — Progress Notes (Signed)
[facility] MD Progress Note  12/29/2019 10:08 AM Mary Carlson  MRN:  [id number] Subjective:  "I'm good."  Ms. Mary Carlson found sitting in the dayroom. She appears euthymic and reports her mood is improved from admission. She had been having conflict with her roommate prior to admission. Her parents are moving her things out of their place while she is in the hospital and have agreed to let her discharge home with them. She feels good about this plan. She states she is working on a positive mindset and feels optimistic about medication changes. She was started on Cymbalta yesterday. She reports "weird dreams" overnight but denies any other potential medication side effects. She denies nightmares. She has been visible and active in the milieu. She denies SI.   She is requesting STD testing. From chart review it appears HIV test was ordered yesterday but never drawn. Will re-order for evening lab draw.   From admission H&P: Patient is a 19 year old female with a past psychiatric history significant for major depression as well as generalized anxiety disorder who presented to the behavioral health urgent care center on 12/25/2019 with suicidal ideation. She presented there with her parents. She stated that she has been treated psychiatrically as an outpatient with Dr. Janan Halter her last psychiatric hospitalization at our facility on 05/06/2019.   Principal Problem: <principal problem not specified> Diagnosis: Active Problems:   MDD (major depressive disorder), recurrent episode, severe (HCC)  Total Time spent with patient: 15 minutes  Past Psychiatric History: See admission H&P  Past Medical History:  Past Medical History:  Diagnosis Date  . Anxiety   . Depression   . History of chicken pox     Past Surgical History:  Procedure Laterality Date  . TONSILLECTOMY N/A 03/02/2019   Family History: History reviewed. No pertinent family history. Family Psychiatric  History: See admission H&P Social  History:  Social History   Substance and Sexual Activity  Alcohol Use Yes  . Alcohol/week: 15.0 standard drinks  . Types: 15 Shots of liquor per week   Comment: 3 x's a week     Social History   Substance and Sexual Activity  Drug Use Never    Social History   Socioeconomic History  . Marital status: Single    Spouse name: Not on file  . Number of children: Not on file  . Years of education: Not on file  . Highest education level: Not on file  Occupational History  . Not on file  Tobacco Use  . Smoking status: Current Every Day Smoker    Types: E-cigarettes  . Smokeless tobacco: Never Used  Vaping Use  . Vaping Use: Every day  Substance and Sexual Activity  . Alcohol use: Yes    Alcohol/week: 15.0 standard drinks    Types: 15 Shots of liquor per week    Comment: 3 x's a week  . Drug use: Never  . Sexual activity: Yes    Birth control/protection: Implant  Other Topics Concern  . Not on file  Social History Narrative  . Not on file   Social Determinants of Health   Financial Resource Strain:   . Difficulty of Paying Living Expenses:   Food Insecurity:   . Worried About Programme researcher, broadcasting/film/video in the Last Year:   . Barista in the Last Year:   Transportation Needs:   . Freight forwarder (Medical):   Marland Kitchen Lack of Transportation (Non-Medical):   Physical Activity:   . Days of Exercise  per Week:   . Minutes of Exercise per Session:   Stress:   . Feeling of Stress :   Social Connections:   . Frequency of Communication with Friends and Family:   . Frequency of Social Gatherings with Friends and Family:   . Attends Religious Services:   . Active Member of Clubs or Organizations:   . Attends Archivist Meetings:   Marland Kitchen Marital Status:    Additional Social History:                         Sleep: Good  Appetite:  Good  Current Medications: Current Facility-Administered Medications  Medication Dose Route Frequency Provider Last Rate  Last Admin  . acetaminophen (TYLENOL) tablet 650 mg  650 mg Oral Q6H PRN Elmarie Shiley A, NP   650 mg at 12/27/19 1833  . alum & mag hydroxide-simeth (MAALOX/MYLANTA) 200-200-20 MG/5ML suspension 30 mL  30 mL Oral Q4H PRN Niel Hummer, NP      . DULoxetine (CYMBALTA) DR capsule 30 mg  30 mg Oral Daily Sharma Covert, MD   30 mg at 12/29/19 0759  . hydrOXYzine (ATARAX/VISTARIL) tablet 25 mg  25 mg Oral Q6H PRN Sharma Covert, MD      . magnesium hydroxide (MILK OF MAGNESIA) suspension 30 mL  30 mL Oral Daily PRN Elmarie Shiley A, NP   30 mL at 12/29/19 0903  . traZODone (DESYREL) tablet 50 mg  50 mg Oral QHS PRN Niel Hummer, NP        Lab Results:  Results for orders placed or performed during the hospital encounter of 12/27/19 (from the past 48 hour(s))  Rapid urine drug screen (hospital performed)     Status: None   Collection Time: 12/28/19  8:21 AM  Result Value Ref Range   Opiates NONE DETECTED NONE DETECTED   Cocaine NONE DETECTED NONE DETECTED   Benzodiazepines NONE DETECTED NONE DETECTED   Amphetamines NONE DETECTED NONE DETECTED   Tetrahydrocannabinol NONE DETECTED NONE DETECTED   Barbiturates NONE DETECTED NONE DETECTED    Comment: (NOTE) DRUG SCREEN FOR MEDICAL PURPOSES ONLY.  IF CONFIRMATION IS NEEDED FOR ANY PURPOSE, NOTIFY LAB WITHIN 5 DAYS.  LOWEST DETECTABLE LIMITS FOR URINE DRUG SCREEN Drug Class                     Cutoff (ng/mL) Amphetamine and metabolites    1000 Barbiturate and metabolites    200 Benzodiazepine                 361 Tricyclics and metabolites     300 Opiates and metabolites        300 Cocaine and metabolites        300 THC                            50 Performed at [facility], [place], [place], [place] [address]   hCG, quantitative, pregnancy     Status: None   Collection Time: 12/28/19  4:33 PM  Result Value Ref Range   hCG, Beta Chain, Quant, S <1 <5 mIU/mL    Comment:          GEST. AGE      CONC.   (mIU/mL)   <=1 WEEK        5 - 50     2 WEEKS  50 - 500     3 WEEKS       100 - 10,000     4 WEEKS     1,000 - 30,000     5 WEEKS     3,500 - 115,000   6-8 WEEKS     12,000 - 270,000    12 WEEKS     15,000 - 220,000        FEMALE AND NON-PREGNANT FEMALE:     LESS THAN 5 mIU/mL Performed at [facility] [address] [place], [place] [address]   CBC with Differential/Platelet     Status: Abnormal   Collection Time: 12/28/19  5:38 PM  Result Value Ref Range   WBC 8.7 4.0 - 10.5 K/uL   RBC 5.06 3.87 - 5.11 MIL/uL   Hemoglobin 14.6 12.0 - 15.0 g/dL   HCT 34.1 (H) 36 - 46 %   MCV 91.3 80.0 - 100.0 fL   MCH 28.9 26.0 - 34.0 pg   MCHC 31.6 30.0 - 36.0 g/dL   RDW 96.2 22.9 - 79.8 %   Platelets 262 150 - 400 K/uL   nRBC 0.0 0.0 - 0.2 %   Neutrophils Relative % 65 %   Neutro Abs 5.6 1.7 - 7.7 K/uL   Lymphocytes Relative 27 %   Lymphs Abs 2.4 0.7 - 4.0 K/uL   Monocytes Relative 7 %   Monocytes Absolute 0.6 0 - 1 K/uL   Eosinophils Relative 1 %   Eosinophils Absolute 0.1 0 - 0 K/uL   Basophils Relative 0 %   Basophils Absolute 0.0 0 - 0 K/uL   Immature Granulocytes 0 %   Abs Immature Granulocytes 0.03 0.00 - 0.07 K/uL    Comment: Performed at [facility], [address] [place], [place] [address]  Comprehensive metabolic panel     Status: Abnormal   Collection Time: 12/28/19  5:38 PM  Result Value Ref Range   Sodium 136 135 - 145 mmol/L   Potassium 4.3 3.5 - 5.1 mmol/L   Chloride 105 98 - 111 mmol/L   CO2 21 (L) 22 - 32 mmol/L   Glucose, Bld 116 (H) 70 - 99 mg/dL    Comment: Glucose reference range applies only to samples taken after fasting for at least 8 hours.   BUN 12 6 - 20 mg/dL   Creatinine, Ser 4.17 0.44 - 1.00 mg/dL   Calcium 9.4 8.9 - 40.8 mg/dL   Total Protein 8.6 (H) 6.5 - 8.1 g/dL   Albumin 4.3 3.5 - 5.0 g/dL   AST 29 15 - 41 U/L   ALT 33 0 - 44 U/L   Alkaline Phosphatase 70 38 - 126 U/L   Total Bilirubin 0.3 0.3 -  1.2 mg/dL   GFR calc non Af Amer >60 >60 mL/min   GFR calc Af Amer >60 >60 mL/min   Anion gap 10 5 - 15    Comment: Performed at [facility], [address], [place], [place] [address]    Blood Alcohol level:  Lab Results  Component Value Date   ETH <10 05/06/2019    Metabolic Disorder Labs: Lab Results  Component Value Date   HGBA1C 6.1 (H) 05/08/2019   MPG 128.37 05/08/2019   No results found for: PROLACTIN Lab Results  Component Value Date   CHOL 175 05/08/2019   TRIG 132 05/08/2019   HDL 62 05/08/2019   CHOLHDL 2.8 05/08/2019   VLDL 26 05/08/2019  LDLCALC 87 05/08/2019    Physical Findings: AIMS: Facial and Oral Movements Muscles of Facial Expression: None, normal Lips and Perioral Area: None, normal Jaw: None, normal Tongue: None, normal,Extremity Movements Upper (arms, wrists, hands, fingers): None, normal Lower (legs, knees, ankles, toes): None, normal, Trunk Movements Neck, shoulders, hips: None, normal, Overall Severity Severity of abnormal movements (highest score from questions above): None, normal Incapacitation due to abnormal movements: None, normal Patient's awareness of abnormal movements (rate only patient's report): No Awareness, Dental Status Current problems with teeth and/or dentures?: No Does patient usually wear dentures?: No  CIWA:    COWS:     Musculoskeletal: Strength & Muscle Tone: within normal limits Gait & Station: normal Patient leans: N/A  Psychiatric Specialty Exam: Physical Exam Vitals and nursing note reviewed.  Constitutional:      Appearance: She is well-developed.  Cardiovascular:     Rate and Rhythm: Normal rate.  Pulmonary:     Effort: Pulmonary effort is normal.  Neurological:     Mental Status: She is alert and oriented to person, place, and time.     Review of Systems  Constitutional: Negative.   Respiratory: Negative for cough and shortness of breath.   Gastrointestinal: Positive for  constipation. Negative for abdominal pain, diarrhea, nausea and vomiting.  Psychiatric/Behavioral: Negative for agitation, behavioral problems, confusion, decreased concentration, dysphoric mood, hallucinations, self-injury, sleep disturbance and suicidal ideas. The patient is not nervous/anxious and is not hyperactive.     Blood pressure (!) 104/58, pulse 83, temperature 98.5 F (36.9 C), temperature source Oral, resp. rate 16, height 5\' 5"  (1.651 m), weight 113.4 kg, SpO2 98 %.Body mass index is 41.6 kg/m.  General Appearance: Casual  Eye Contact:  Good  Speech:  Normal Rate  Volume:  Normal  Mood:  Euthymic  Affect:  Appropriate and Congruent  Thought Process:  Coherent  Orientation:  Full (Time, Place, and Person)  Thought Content:  Logical  Suicidal Thoughts:  No  Homicidal Thoughts:  No  Memory:  Immediate;   Fair Recent;   Fair  Judgement:  Intact  Insight:  Fair  Psychomotor Activity:  Normal  Concentration:  Concentration: Fair and Attention Span: Fair  Recall:  of Knowledge:  Fair  Language:  Good  Akathisia:  No  Handed:  Right  AIMS (if indicated):     Assets:  Communication Skills Desire for Improvement Housing Social Support  ADL's:  Intact  Cognition:  WNL  Sleep:        Treatment Plan Summary: Daily contact with patient to assess and evaluate symptoms and progress in treatment and Medication management   Continue inpatient hospitalization. Check labs- HIV, Hep B/C, RPR, chlamydia/gonorrhea.  Continue Cymbalta 30 mg PO daily for depression/anxiety Continue Vistaril 25 mg PO Q6HR PRN anxiety Continue trazodone 50 mg PO QHS PRN insomnia  Patient will participate in the therapeutic group milieu.  Discharge disposition in progress.   [employer], NP 12/29/2019, 10:08 AM

## 2019-12-29 NOTE — [facility] Group Notes (Signed)
[facility] LCSW Group Therapy Note  Date/Time:  12/29/2019 9:00-10:00 or 10:00-11:00AM  Type of Therapy and Topic:  Group Therapy:  Healthy and Unhealthy Supports  Participation Level:  Active   Description of Group:  Patients in this group were introduced to the idea of adding a variety of healthy supports to address the various needs in their lives.Patients discussed what additional healthy supports could be helpful in their recovery and wellness after discharge in order to prevent future hospitalizations.   An emphasis was placed on using counselor, doctor, therapy groups, 12-step groups, and problem-specific support groups to expand supports.   Therapeutic Goals:   1)  discuss importance of adding supports to stay well once out of the hospital  2)  compare healthy versus unhealthy supports and identify some examples of each  3)  generate ideas and descriptions of healthy supports that can be added  4)  offer mutual support about how to address unhealthy supports  5)  encourage active participation in and adherence to discharge plan    Summary of Patient Progress:  The patient expressed a willingness to add group therapy as support(s) to help in her recovery journey.  She stated her job is a good support to her, as she enjoys working with the elderly.  She was fine with being used in the examples given about how to establish boundaries.   Therapeutic Modalities:   Motivational Interviewing Brief Solution-Focused Therapy  Ambrose Mantle, LCSW

## 2019-12-30 LAB — GC/CHLAMYDIA PROBE AMP (~~LOC~~) NOT AT ARMC
Chlamydia: NEGATIVE
Comment: NEGATIVE
Comment: NORMAL
Neisseria Gonorrhea: NEGATIVE

## 2019-12-30 LAB — HEPATITIS B SURFACE ANTIGEN: Hepatitis B Surface Ag: NONREACTIVE

## 2019-12-30 LAB — RPR: RPR Ser Ql: NONREACTIVE

## 2019-12-30 LAB — POC SARS CORONAVIRUS 2 AG: SARS Coronavirus 2 Ag: NEGATIVE

## 2019-12-30 LAB — HEPATITIS C ANTIBODY: HCV Ab: NONREACTIVE

## 2019-12-30 MED ORDER — DULOXETINE HCL 30 MG PO CPEP: 30.0000 mg | ORAL_CAPSULE | Freq: Every day | ORAL | 0 refills | Status: DC

## 2019-12-30 MED ORDER — AZITHROMYCIN 250 MG PO TABS: ORAL_TABLET | ORAL | 0 refills | Status: DC

## 2019-12-30 MED ORDER — AZITHROMYCIN 250 MG PO TABS
1000.0000 mg | ORAL_TABLET | Freq: Once | ORAL | Status: DC
  Filled 2019-12-30 (×2): qty 4

## 2019-12-30 MED ORDER — NICOTINE POLACRILEX 2 MG MT GUM: 2.0000 mg | CHEWING_GUM | OROMUCOSAL | Status: DC | PRN

## 2019-12-30 MED ORDER — NICOTINE POLACRILEX 2 MG MT GUM: 2.0000 mg | CHEWING_GUM | OROMUCOSAL | 0 refills | Status: DC | PRN

## 2019-12-30 NOTE — Tx Team (Signed)
Interdisciplinary Treatment and Diagnostic Plan Update  12/30/2019 Time of Session: 9:00am Mary Carlson MRN: [id number]  Principal Diagnosis: <principal problem not specified>  Secondary Diagnoses: Active Problems:   MDD (major depressive disorder), recurrent episode, severe (HCC)   Current Medications:  Current Facility-Administered Medications  Medication Dose Route Frequency Provider Last Rate Last Admin  . acetaminophen (TYLENOL) tablet 650 mg  650 mg Oral Q6H PRN Fransisca Kaufmann A, NP   650 mg at 12/27/19 1833  . alum & mag hydroxide-simeth (MAALOX/MYLANTA) 200-200-20 MG/5ML suspension 30 mL  30 mL Oral Q4H PRN Thermon Leyland, NP      . azithromycin (ZITHROMAX) tablet 1,000 mg  1,000 mg Oral Once Aldean Baker, NP      . DULoxetine (CYMBALTA) DR capsule 30 mg  30 mg Oral Daily Antonieta Pert, MD   30 mg at 12/30/19 0736  . hydrOXYzine (ATARAX/VISTARIL) tablet 25 mg  25 mg Oral Q6H PRN Antonieta Pert, MD      . magnesium hydroxide (MILK OF MAGNESIA) suspension 30 mL  30 mL Oral Daily PRN Fransisca Kaufmann A, NP   30 mL at 12/29/19 0903  . nicotine polacrilex (NICORETTE) gum 2 mg  2 mg Oral PRN Antonieta Pert, MD      . traZODone (DESYREL) tablet 50 mg  50 mg Oral QHS PRN Thermon Leyland, NP       PTA Medications: Medications Prior to Admission  Medication Sig Dispense Refill Last Dose  . busPIRone (BUSPAR) 10 MG tablet Take 10 mg by mouth 2 (two) times daily.     Marland Kitchen EPINEPHrine 0.3 mg/0.3 mL IJ SOAJ injection Inject 0.3 mLs (0.3 mg total) into the muscle as needed for anaphylaxis. 2 each 0   . sertraline (ZOLOFT) 25 MG tablet Take 25 mg by mouth daily.       Patient Stressors:    Patient Strengths:    Treatment Modalities: Medication Management, Group therapy, Case management,  1 to 1 session with clinician, Psychoeducation, Recreational therapy.   Physician Treatment Plan for Primary Diagnosis: <principal problem not specified> Long Term Goal(s): Improvement in  symptoms so as ready for discharge Improvement in symptoms so as ready for discharge   Short Term Goals: Ability to identify changes in lifestyle to reduce recurrence of condition will improve Ability to verbalize feelings will improve Ability to disclose and discuss suicidal ideas Ability to demonstrate self-control will improve Ability to identify and develop effective coping behaviors will improve Ability to maintain clinical measurements within normal limits will improve Ability to identify triggers associated with substance abuse/mental health issues will improve Ability to identify changes in lifestyle to reduce recurrence of condition will improve Ability to verbalize feelings will improve Ability to disclose and discuss suicidal ideas Ability to demonstrate self-control will improve Ability to identify and develop effective coping behaviors will improve Ability to maintain clinical measurements within normal limits will improve Ability to identify triggers associated with substance abuse/mental health issues will improve  Medication Management: Evaluate patient's response, side effects, and tolerance of medication regimen.  Therapeutic Interventions: 1 to 1 sessions, Unit Group sessions and Medication administration.  Evaluation of Outcomes: Adequate for Discharge  Physician Treatment Plan for Secondary Diagnosis: Active Problems:   MDD (major depressive disorder), recurrent episode, severe (HCC)  Long Term Goal(s): Improvement in symptoms so as ready for discharge Improvement in symptoms so as ready for discharge   Short Term Goals: Ability to identify changes in lifestyle to reduce recurrence of condition  will improve Ability to verbalize feelings will improve Ability to disclose and discuss suicidal ideas Ability to demonstrate self-control will improve Ability to identify and develop effective coping behaviors will improve Ability to maintain clinical measurements  within normal limits will improve Ability to identify triggers associated with substance abuse/mental health issues will improve Ability to identify changes in lifestyle to reduce recurrence of condition will improve Ability to verbalize feelings will improve Ability to disclose and discuss suicidal ideas Ability to demonstrate self-control will improve Ability to identify and develop effective coping behaviors will improve Ability to maintain clinical measurements within normal limits will improve Ability to identify triggers associated with substance abuse/mental health issues will improve     Medication Management: Evaluate patient's response, side effects, and tolerance of medication regimen.  Therapeutic Interventions: 1 to 1 sessions, Unit Group sessions and Medication administration.  Evaluation of Outcomes: Adequate for Discharge   RN Treatment Plan for Primary Diagnosis: <principal problem not specified> Long Term Goal(s): Knowledge of disease and therapeutic regimen to maintain health will improve  Short Term Goals: Ability to demonstrate self-control, Ability to participate in decision making will improve, Ability to identify and develop effective coping behaviors will improve and Compliance with prescribed medications will improve  Medication Management: RN will administer medications as ordered by provider, will assess and evaluate patient's response and provide education to patient for prescribed medication. RN will report any adverse and/or side effects to prescribing provider.  Therapeutic Interventions: 1 on 1 counseling sessions, Psychoeducation, Medication administration, Evaluate responses to treatment, Monitor vital signs and CBGs as ordered, Perform/monitor CIWA, COWS, AIMS and Fall Risk screenings as ordered, Perform wound care treatments as ordered.  Evaluation of Outcomes: Adequate for Discharge   LCSW Treatment Plan for Primary Diagnosis: <principal problem not  specified> Long Term Goal(s): Safe transition to appropriate next level of care at discharge, Engage patient in therapeutic group addressing interpersonal concerns.  Short Term Goals: Engage patient in aftercare planning with referrals and resources, Increase social support, Increase emotional regulation, Identify triggers associated with mental health/substance abuse issues and Increase skills for wellness and recovery  Therapeutic Interventions: Assess for all discharge needs, 1 to 1 time with Social worker, Explore available resources and support systems, Assess for adequacy in community support network, Educate family and significant other(s) on suicide prevention, Complete Psychosocial Assessment, Interpersonal group therapy.  Evaluation of Outcomes: Adequate for Discharge   Progress in Treatment: Attending groups: Yes. Participating in groups: Yes. Taking medication as prescribed: Yes. Toleration medication: Yes. Family/Significant other contact made: Yes, individual(s) contacted:  mother Patient understands diagnosis: Yes. Discussing patient identified problems/goals with staff: Yes. Medical problems stabilized or resolved: Yes. Denies suicidal/homicidal ideation: Yes. Issues/concerns per patient self-inventory: No.  New problem(s) identified: No, Describe:  none  New Short Term/Long Term Goal(s): medication management for mood stabilization; elimination of SI thoughts; development of comprehensive mental wellness/sobriety plan.  Patient Goals:  "No more suicidal thoughts."  Discharge Plan or Barriers: Staying with family at discharge, following up with her current provider, [facility] [facility]  Reason for Continuation of Hospitalization: Anxiety  Estimated Length of Stay: discharging today  Attendees: Patient: Mary Carlson 12/30/2019 9:59 AM  Physician: Dr. Mallie Darting 12/30/2019 9:59 AM  Nursing:  12/30/2019 9:59 AM  RN Care Manager: 12/30/2019 9:59 AM  Social Worker: Stephanie Acre,  [place] 12/30/2019 9:59 AM  Recreational Therapist:  12/30/2019 9:59 AM  Other:  12/30/2019 9:59 AM  Other:  12/30/2019 9:59 AM  Other: 12/30/2019 9:59 AM    Scribe  for Treatment Team: Darreld Mclean, Theresia Majors 12/30/2019 9:59 AM

## 2019-12-30 NOTE — [facility] Suicide Risk Assessment (Signed)
[facility] INPATIENT:  Family/Significant Other Suicide Prevention Education  Suicide Prevention Education:  Education Completed; with mother, Donald Prose [phone]) has been identified by the patient as the family member/significant other with whom the patient will be residing, and identified as the person(s) who will aid the patient in the event of a mental health crisis (suicidal ideations/suicide attempt).  With written consent from the patient, the family member/significant other has been provided the following suicide prevention education, prior to the and/or following the discharge of the patient.  The suicide prevention education provided includes the following:  Suicide risk factors  Suicide prevention and interventions  National Suicide Hotline telephone number  [facility] assessment telephone number  [facility] Emergency Assistance 911  [facility] and/or Residential Mobile Crisis Unit telephone number  Request made of family/significant other to:  Remove weapons (e.g., guns, rifles, knives), all items previously/currently identified as safety concern.    Remove drugs/medications (over-the-counter, prescriptions, illicit drugs), all items previously/currently identified as a safety concern.  The family member/significant other verbalizes understanding of the suicide prevention education information provided.  The family member/significant other agrees to remove the items of safety concern listed above.  Mary Carlson reports she does not have any questions or concerns regarding her daughter's discharge. She reports that the patient is not returning to her apartment with roommates, however will discharge home with her instead.   Mary Carlson states that she will pick the patient up at 1:00pm. CSW will continue to follow for a safe discharge.   Mary Carlson 12/30/2019, 10:05 AM

## 2019-12-30 NOTE — Discharge Summary (Signed)
Physician Discharge Summary Note  Patient:  Mary Carlson is an 19 y.o., female MRN:  [id number] DOB:  28-Sep-2000 Patient phone:  [phone] (home)  Patient address:   [address] [place] [address],  Total Time spent with patient: 15 minutes  Date of Admission:  12/27/2019 Date of Discharge: 12/30/19  Reason for Admission:  suicidal ideation  Principal Problem: <principal problem not specified> Discharge Diagnoses: Active Problems:   MDD (major depressive disorder), recurrent episode, severe (HCC)   Past Psychiatric History: Patient reported having been treated for depression anxiety for years.  She denied previous psychiatric hospitalization or suicide attempts.  She stated she had been treated in therapy and with medications since adolescence.  It is known that she has been previously treated with Escitalopram, sertraline, buspirone and trazodone.  Past Medical History:  Past Medical History:  Diagnosis Date  . Anxiety   . Depression   . History of chicken pox     Past Surgical History:  Procedure Laterality Date  . TONSILLECTOMY N/A 03/02/2019   Family History: History reviewed. No pertinent family history. Family Psychiatric  History: Denies Social History:  Social History   Substance and Sexual Activity  Alcohol Use Yes  . Alcohol/week: 15.0 standard drinks  . Types: 15 Shots of liquor per week   Comment: 3 x's a week     Social History   Substance and Sexual Activity  Drug Use Never    Social History   Socioeconomic History  . Marital status: Single    Spouse name: Not on file  . Number of children: Not on file  . Years of education: Not on file  . Highest education level: Not on file  Occupational History  . Not on file  Tobacco Use  . Smoking status: Current Every Day Smoker    Types: E-cigarettes  . Smokeless tobacco: Never Used  Vaping Use  . Vaping Use: Every day  Substance and Sexual Activity  . Alcohol use: Yes     Alcohol/week: 15.0 standard drinks    Types: 15 Shots of liquor per week    Comment: 3 x's a week  . Drug use: Never  . Sexual activity: Yes    Birth control/protection: Implant  Other Topics Concern  . Not on file  Social History Narrative  . Not on file   Social Determinants of Health   Financial Resource Strain:   . Difficulty of Paying Living Expenses:   Food Insecurity:   . Worried About Programme researcher, broadcasting/film/video in the Last Year:   . Barista in the Last Year:   Transportation Needs:   . Freight forwarder (Medical):   Marland Kitchen Lack of Transportation (Non-Medical):   Physical Activity:   . Days of Exercise per Week:   . Minutes of Exercise per Session:   Stress:   . Feeling of Stress :   Social Connections:   . Frequency of Communication with Friends and Family:   . Frequency of Social Gatherings with Friends and Family:   . Attends Religious Services:   . Active Member of Clubs or Organizations:   . Attends Banker Meetings:   Marland Kitchen Marital Status:     Hospital Course:  From admission H&P: Patient is a 19 year old female with a past psychiatric history significant for major depression as well as generalized anxiety disorder who presented to the behavioral health urgent care center on 12/25/2019 with suicidal ideation. She presented there with her parents. She stated  that she has been treated psychiatrically as an outpatient with Dr. Janan Halter her last psychiatric hospitalization at our facility on 05/06/2019. At that time she had been discharged on buspirone, sertraline and trazodone. After discharge she had followed up with Dr. Alanda Slim had been in therapy. She stated that the therapy was not helpful, and she stopped going. Previously they had attempted to increase her sertraline dose to 75 mg p.o. daily, but the patient became "dizzy". That was reduced back down to 50 mg p.o. daily. He stated that most recently he had felt more suicidal. She stated been  going on for 2 to 3 months. She stated that she was having problems with her roommate, and other psychosocial issues. This upset her greatly. She stated that things got so bad that yesterday she was developing a plan to kill herself. She admitted to compliance with her medications, but also admitted to drinking alcohol excessively. She decided to go to the behavioral health urgent care center for assistance. She admitted there that she was helpless, hopeless and worthless as well as suicidal.   Her last psychiatric hospitalization was on 05/06/2019 at that time she had planned on overdosing on pills and Clorox.  Her father came into the room and found her to be suicidal.  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  She admitted at that time that she had been taking psychiatric medication since middle school. On her admission at that time she was on Escitalopram.  She was admitted to the hospital for evaluation and stabilization.  Ms. Hinks was admitted for suicidal ideation. She remained on the [facility] unit for three days. She was started on Cymbalta. She participated in group therapy on the unit. She responded well to treatment with no adverse effects reported. She has shown improved mood, affect, sleep, and interaction. She denies any SI/HI/AVH and contracts for safety. She is discharging on the medications listed below. She agrees to follow up with Dr. Evelene Croon and [facility] (see below). Patient is provided with prescriptions for medications upon discharge. She is discharging home with her parents.  Physical Findings: AIMS: Facial and Oral Movements Muscles of Facial Expression: None, normal Lips and Perioral Area: None, normal Jaw: None, normal Tongue: None, normal,Extremity Movements Upper (arms, wrists, hands, fingers): None, normal Lower (legs, knees, ankles, toes): None, normal, Trunk Movements Neck, shoulders, hips: None, normal, Overall  Severity Severity of abnormal movements (highest score from questions above): None, normal Incapacitation due to abnormal movements: None, normal Patient's awareness of abnormal movements (rate only patient's report): No Awareness, Dental Status Current problems with teeth and/or dentures?: No Does patient usually wear dentures?: No  CIWA:    COWS:     Musculoskeletal: Strength & Muscle Tone: within normal limits Gait & Station: normal Patient leans: N/A  Psychiatric Specialty Exam: Physical Exam Vitals and nursing note reviewed.  Constitutional:      Appearance: She is well-developed.  Cardiovascular:     Rate and Rhythm: Normal rate.  Pulmonary:     Effort: Pulmonary effort is normal.  Neurological:     Mental Status: She is alert and oriented to person, place, and time.     Review of Systems  Constitutional: Negative.   Respiratory: Negative for cough and shortness of breath.   Psychiatric/Behavioral: Negative for agitation, behavioral problems, confusion, decreased concentration, dysphoric mood, hallucinations, self-injury, sleep disturbance and suicidal ideas. The patient is not nervous/anxious and is not hyperactive.  Blood pressure 121/65, pulse 88, temperature 98.1 F (36.7 C), temperature source Oral, resp. rate 16, height 5\' 5"  (1.651 m), weight 113.4 kg, SpO2 98 %.Body mass index is 41.6 kg/m.  See MD's discharge SRA      Has this patient used any form of tobacco in the last 30 days? (Cigarettes, Smokeless Tobacco, Cigars, and/or Pipes) Yes, a prescription for an FDA-approved medication for tobacco cessation was offered at discharge.   Blood Alcohol level:  Lab Results  Component Value Date   ETH <10 05/06/2019    Metabolic Disorder Labs:  Lab Results  Component Value Date   HGBA1C 6.1 (H) 05/08/2019   MPG 128.37 05/08/2019   No results found for: PROLACTIN Lab Results  Component Value Date   CHOL 175 05/08/2019   TRIG 132 05/08/2019   HDL 62  05/08/2019   CHOLHDL 2.8 05/08/2019   VLDL 26 05/08/2019   LDLCALC 87 05/08/2019    See Psychiatric Specialty Exam and Suicide Risk Assessment completed by Attending Physician prior to discharge.  Discharge destination:  Home  Is patient on multiple antipsychotic therapies at discharge:  No   Has Patient had three or more failed trials of antipsychotic monotherapy by history:  No  Recommended Plan for Multiple Antipsychotic Therapies: NA  Discharge Instructions    Discharge instructions   Complete by: As directed    Patient is instructed to take all prescribed medications as recommended. Report any side effects or adverse reactions to your outpatient psychiatrist. Patient is instructed to abstain from alcohol and illegal drugs while on prescription medications. In the event of worsening symptoms, patient is instructed to call the crisis hotline, 911, or go to the nearest emergency department for evaluation and treatment.     Allergies as of 12/30/2019      Reactions   Food Itching   Walnuts   Other Anaphylaxis   Walnuts & other tree nuts   Watermelon [citrullus Vulgaris] Other (See Comments)   Itchy throat   Banana Rash      Medication List    STOP taking these medications   busPIRone 10 MG tablet Commonly known as: BUSPAR   EPINEPHrine 0.3 mg/0.3 mL Soaj injection Commonly known as: EPI-PEN   sertraline 25 MG tablet Commonly known as: ZOLOFT     TAKE these medications     Indication  azithromycin 250 MG tablet Commonly known as: ZITHROMAX Take four tablets (1000 mg) by mouth once.  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 Start taking on: December 31, 2019  Indication: Major Depressive Disorder   nicotine polacrilex 2 MG gum Commonly known as: NICORETTE Take 1 each (2 mg total) by mouth as needed for smoking cessation.  Indication: Nicotine  Addiction       Follow-up Information    January 02, 2020, MD. Go on 01/24/2020.   Specialty: Psychiatry Why: You have an appointment for medication management on 01/24/20 at 9:15 am with Dr. 01/26/20. Please be sure to bring any discharge paperwork, inlcuding your list of medications.  Contact information: [address] [place] [phone]        [facility] Schedule an appointment as soon as possible for a visit.   Why: Please call this provider to establish services for individual and group therapy. Be sure to have your discharge paperwork from this hospitalization.  Contact information: [address] [place] [id number] [phone]               Follow-up recommendations: Activity as tolerated. Diet as recommended by primary care physician. Keep all scheduled follow-up appointments as recommended.   Comments:   Patient is instructed to take all prescribed medications as recommended. Report any side effects or adverse reactions to your outpatient psychiatrist. Patient is instructed to abstain from alcohol and illegal drugs while on prescription medications. In the event of worsening symptoms, patient is instructed to call the crisis hotline, 911, or go to the nearest emergency department for evaluation and treatment.  Signed: Aldean BakerJanet E Quill Grinder, NP 12/30/2019, 4:41 PM

## 2019-12-30 NOTE — Progress Notes (Signed)
  [facility] Adult Case Management Discharge Plan :  Will you be returning to the same living situation after discharge:  No. Patient is discharging home with her parents.  At discharge, do you have transportation home?: Yes,  patient's mother is picking her up Do you have the ability to pay for your medications: Yes,  BCBS  Release of information consent forms completed and in the chart;  Patient's signature needed at discharge.  Patient to Follow up at:  Follow-up Information    Milagros Evener, MD. Go on 01/24/2020.   Specialty: Psychiatry Why: You have an appointment for medication management on 01/24/20 at 9:15 am with Dr. Evelene Croon. Please be sure to bring any discharge paperwork, inlcuding your list of medications.  Contact information: [address] [place] [address] [phone]        [facility] Schedule an appointment as soon as possible for a visit.   Why: Please call this provider to establish services for individual and group therapy. Be sure to have your discharge paperwork from this hospitalization.  Contact information: [address] [place] [address] [phone]               Next level of care provider has access to [facility] Link:yes  Safety Planning and Suicide Prevention discussed: Yes,  with the patient's mother     Has patient been referred to the Quitline?: Patient refused referral  Patient has been referred for addiction treatment: N/A  Maeola Sarah, LCSWA 12/30/2019, 10:19 AM

## 2019-12-30 NOTE — Progress Notes (Signed)
D:  Patient denied SI and HI, contracts for safety.  Denied A/V hallucinations. A:  Patient refused her antibiotic this morning.  Stated she would get and antibiotic to take at home. R:  Safety maintained with 15 minute checks.

## 2019-12-30 NOTE — Progress Notes (Signed)
Discharge Note:  Patient discharged home with family member.  Patient denied SI and HI.  Denied A/V hallucinations.  Suicide prevention information given and discussed with patient who stated she understood and had no questions.  Patient stated she received all her belongings, clothing, toiletries, misc items, etc.  Patient stated she  appreciated all assistance received from [facility] staff.  All required discharge information given to patient at discharge.  

## 2019-12-30 NOTE — Progress Notes (Signed)
Recreation Therapy Notes  Date: 6.28.21 Time: 0930 Location: [address] Group Room  Group Topic: Stress Management  Goal Area(s) Addresses:  Patient will identify positive stress management techniques. Patient will identify benefits of using stress management post d/c.  Intervention: Stress Management  Activity : Guided Imagery.  LRT read a script that took patients on a journey to the beach to enjoy the peaceful waves.  Patients were to listen and follow along as script was read.  Education:  Stress Management, Discharge Planning.   Education Outcome: Acknowledges Education  Clinical Observations/Feedback: Pt did not attend group session.     Caroll Rancher, LRT/CTRS         Caroll Rancher A 12/30/2019 11:36 AM

## 2019-12-30 NOTE — [facility] Suicide Risk Assessment (Signed)
[facility] Discharge Suicide Risk Assessment   Principal Problem: <principal problem not specified> Discharge Diagnoses: Active Problems:   MDD (major depressive disorder), recurrent episode, severe (HCC)   Total Time spent with patient: 15 minutes  Musculoskeletal: Strength & Muscle Tone: within normal limits Gait & Station: normal Patient leans: N/A  Psychiatric Specialty Exam: Review of Systems  Neurological: Positive for dizziness.  All other systems reviewed and are negative.   Blood pressure 121/65, pulse 88, temperature 98.1 F (36.7 C), temperature source Oral, resp. rate 16, height 5\' 5"  (1.651 m), weight 113.4 kg, SpO2 98 %.Body mass index is 41.6 kg/m.  General Appearance: Casual  Eye Contact::  Good  Speech:  Normal Rate409  Volume:  Normal  Mood:  Anxious  Affect:  Congruent  Thought Process:  Coherent and Descriptions of Associations: Intact  Orientation:  Full (Time, Place, and Person)  Thought Content:  Logical  Suicidal Thoughts:  No  Homicidal Thoughts:  No  Memory:  Immediate;   Good Recent;   Good Remote;   Good  Judgement:  Intact  Insight:  Fair  Psychomotor Activity:  Normal  Concentration:  Good  Recall:  Good  Fund of Knowledge:Good  Language: Good  Akathisia:  Negative  Handed:  Right  AIMS (if indicated):     Assets:  Desire for Improvement Housing Resilience Social Support  Sleep:  Number of Hours: 4.5  Cognition: WNL  ADL's:  Intact   Mental Status Per Nursing Assessment::   On Admission:  Suicidal ideation indicated by patient, Suicide plan  Demographic Factors:  Adolescent or young adult, Living alone and Unemployed  Loss Factors: NA  Historical Factors: Impulsivity  Risk Reduction Factors:   Positive social support  Continued Clinical Symptoms:  Depression:   Comorbid alcohol abuse/dependence Impulsivity Alcohol/Substance Abuse/Dependencies  Cognitive Features That Contribute To Risk:  None    Suicide Risk:   Minimal: No identifiable suicidal ideation.  Patients presenting with no risk factors but with morbid ruminations; may be classified as minimal risk based on the severity of the depressive symptoms    Plan Of Care/Follow-up recommendations:  Activity:  ad lib  [phone], MD 12/30/2019, 8:14 AM

## 2019-12-31 ENCOUNTER — Telehealth (HOSPITAL_COMMUNITY): Payer: Self-pay | Admitting: Professional

## 2020-01-03 NOTE — Progress Notes (Signed)
Spiritual care group on grief and loss facilitated by chaplain Burnis Kingfisher MDiv, [facility]  Group Goal:  Support / Education around grief and loss Members engage in facilitated group support and psycho-social education.  Group Description:  Following introductions and group rules, group members engaged in facilitated group dialog and support around topic of loss, with particular support around experiences of loss in their lives. Group Identified types of loss (relationships / self / things) and identified patterns, circumstances, and changes that precipitate losses. Reflected on thoughts / feelings around loss, normalized grief responses, and recognized variety in grief experience.   Group noted Worden's four tasks of grief in discussion.  Group drew on Adlerian / Rogerian, narrative, MI, Patient Progress: invited.  Did not attend.

## 2020-01-13 ENCOUNTER — Ambulatory Visit: Payer: BC Managed Care – PPO | Attending: Family Medicine

## 2020-01-13 DIAGNOSIS — Z23 Encounter for immunization: Secondary | ICD-10-CM

## 2020-01-13 NOTE — Progress Notes (Signed)
   Covid-19 Vaccination Clinic  Name:  Mary Carlson    MRN: [id number] DOB: 2000/12/17  01/13/2020  [name] was observed post Covid-19 immunization for 15 minutes without incident. She was provided with Vaccine Information Sheet and instruction to access the V-Safe system.   [name] was instructed to call 911 with any severe reactions post vaccine: Marland Kitchen Difficulty breathing  . Swelling of face and throat  . A fast heartbeat  . A bad rash all over body  . Dizziness and weakness   Immunizations Administered    Name Date Dose VIS Date Route   Pfizer COVID-19 Vaccine 01/13/2020  3:22 PM 0.3 mL 08/28/2018 Intramuscular   Manufacturer: [employer], [employer]   Lot: [id number]   NDC: [address]-4

## 2020-02-04 ENCOUNTER — Ambulatory Visit: Payer: BC Managed Care – PPO | Attending: Internal Medicine

## 2020-02-04 DIAGNOSIS — Z23 Encounter for immunization: Secondary | ICD-10-CM

## 2020-02-04 NOTE — Progress Notes (Signed)
   Covid-19 Vaccination Clinic  Name:  Mary Carlson    MRN: [id number] DOB: 2000-07-26  02/04/2020  Ms. Mary Carlson was observed post Covid-19 immunization for 15 minutes without incident. She was provided with Vaccine Information Sheet and instruction to access the V-Safe system.   Ms. Mary Carlson was instructed to call 911 with any severe reactions post vaccine: Marland Kitchen Difficulty breathing  . Swelling of face and throat  . A fast heartbeat  . A bad rash all over body  . Dizziness and weakness   Immunizations Administered    Name Date Dose VIS Date Route   Pfizer COVID-19 Vaccine 02/04/2020 11:58 AM 0.3 mL 08/28/2018 Intramuscular   Manufacturer: [employer], [employer]   Lot: [id number]   NDC: [address]-4

## 2020-03-25 ENCOUNTER — Other Ambulatory Visit: Payer: BC Managed Care – PPO

## 2020-03-25 DIAGNOSIS — Z20822 Contact with and (suspected) exposure to covid-19: Secondary | ICD-10-CM

## 2020-03-27 LAB — NOVEL CORONAVIRUS, NAA: SARS-CoV-2, NAA: NOT DETECTED

## 2020-03-27 LAB — SARS-COV-2, NAA 2 DAY TAT

## 2020-06-23 ENCOUNTER — Other Ambulatory Visit: Payer: Self-pay

## 2020-06-23 ENCOUNTER — Other Ambulatory Visit (HOSPITAL_COMMUNITY)
Admission: RE | Admit: 2020-06-23 | Discharge: 2020-06-23 | Disposition: A | Discharge status: Home / Self Care | Payer: BC Managed Care – PPO | Source: Ambulatory Visit | Attending: Physician Assistant | Admitting: Physician Assistant

## 2020-06-23 ENCOUNTER — Other Ambulatory Visit: Payer: Self-pay | Admitting: Physician Assistant

## 2020-06-23 ENCOUNTER — Encounter: Payer: Self-pay | Admitting: Physician Assistant

## 2020-06-23 ENCOUNTER — Ambulatory Visit (INDEPENDENT_AMBULATORY_CARE_PROVIDER_SITE_OTHER): Payer: BC Managed Care – PPO | Admitting: Physician Assistant

## 2020-06-23 VITALS — BP 130/68 | HR 87 | Temp 97.6°F | Ht 65.0 in | Wt 257.4 lb

## 2020-06-23 DIAGNOSIS — L989 Disorder of the skin and subcutaneous tissue, unspecified: Secondary | ICD-10-CM

## 2020-06-23 DIAGNOSIS — R82998 Other abnormal findings in urine: Secondary | ICD-10-CM | POA: Diagnosis not present

## 2020-06-23 DIAGNOSIS — Z113 Encounter for screening for infections with a predominantly sexual mode of transmission: Secondary | ICD-10-CM

## 2020-06-23 LAB — POCT URINALYSIS DIPSTICK
Bilirubin, UA: NEGATIVE
Glucose, UA: NEGATIVE
Ketones, UA: NEGATIVE
Nitrite, UA: NEGATIVE
Protein, UA: NEGATIVE
Spec Grav, UA: 1.015 (ref 1.010–1.025)
Urobilinogen, UA: 0.2 E.U./dL
pH, UA: 6.5 (ref 5.0–8.0)

## 2020-06-23 MED ORDER — MUPIROCIN CALCIUM 2 % EX CREA: TOPICAL_CREAM | CUTANEOUS | 0 refills | Status: DC

## 2020-06-23 NOTE — Progress Notes (Signed)
Mary Carlson is a 19 y.o. female here for a new problem.  I acted as a Neurosurgeon for [employer], PA-C Corky Mull, LPN   History of Present Illness:   Chief Complaint  Patient presents with  . Vaginal Itching    HPI   Vaginal itching Pt c/o vaginal itching, started after having intercourse on her 19st birthday with a partner, states that the condom slipped off. She is worried that the condom is still inside of her. Denies vaginal discharge and no bleeding with intercourse. Is not aware of any STDs or symptoms with her partner.   Wt Readings from Last 5 Encounters:  06/23/20 257 lb 6.1 oz (116.7 kg)  08/16/19 261 lb 8 oz (118.6 kg)  04/02/19 240 lb 8 oz (109.1 kg) (>99 %, Z= 2.43)*  02/20/19 220 lb (99.8 kg) (99 %, Z= 2.21)*  07/15/18 220 lb (99.8 kg) (99 %, Z= 2.20)*   * Growth percentiles are based on CDC (Girls, 2-20 Years) data.     Past Medical History:  Diagnosis Date  . Anxiety   . Depression   . History of chicken pox      Social History   Tobacco Use  . Smoking status: Current Every Day Smoker    Types: E-cigarettes  . Smokeless tobacco: Never Used  Vaping Use  . Vaping Use: Every day  Substance Use Topics  . Alcohol use: Yes    Alcohol/week: 15.0 standard drinks    Types: 15 Shots of liquor per week    Comment: 3 x's a week  . Drug use: Never    Past Surgical History:  Procedure Laterality Date  . TONSILLECTOMY N/A 03/02/2019    History reviewed. No pertinent family history.  Allergies  Allergen Reactions  . Food Itching    Walnuts   . Other Anaphylaxis and Other (See Comments)    Walnuts & other tree nuts  . Watermelon [Citrullus Vulgaris] Other (See Comments)    Itchy throat  . Banana Rash    Current Medications:   Current Outpatient Medications:  Marland Kitchen  Multiple Vitamins-Minerals (MULTI-VITAMIN GUMMIES PO), Take 2 each by mouth daily in the afternoon., Disp: , Rfl:  .  mupirocin cream (BACTROBAN) 2 %, Apply to affected area  1-2 times daily, Disp: 15 g, Rfl: 0   Review of Systems:   ROS Negative unless otherwise specified per HPI.  Vitals:   Vitals:   06/23/20 1555  BP: 130/68  Pulse: 87  Temp: 97.6 F (36.4 C)  TempSrc: Temporal  SpO2: 97%  Weight: 257 lb 6.1 oz (116.7 kg)  Height: 5\' 5"  (1.651 m)     Body mass index is 42.83 kg/m.  Physical Exam:   Physical Exam Vitals and nursing note reviewed.  Constitutional:      General: She is not in acute distress.    Appearance: She is well-developed. She is not ill-appearing, toxic-appearing or sickly-appearing.  Cardiovascular:     Rate and Rhythm: Normal rate and regular rhythm.     Pulses: Normal pulses.     Heart sounds: Normal heart sounds, S1 normal and S2 normal.     Comments: No LE edema Pulmonary:     Effort: Pulmonary effort is normal.     Breath sounds: Normal breath sounds.  Genitourinary:    Comments: pedunculated erythematous lesion to R inner inguinal area approximately 1/2 cm in size; no TTP Skin:    General: Skin is warm, dry and intact.  Neurological:  Mental Status: She is alert.     GCS: GCS eye subscore is 4. GCS verbal subscore is 5. GCS motor subscore is 6.  Psychiatric:        Mood and Affect: Mood and affect normal.        Speech: Speech normal.        Behavior: Behavior normal. Behavior is cooperative.     Results for orders placed or performed in visit on 06/23/20  POCT urinalysis dipstick  Result Value Ref Range   Color, UA yellow    Clarity, UA cloudy    Glucose, UA Negative Negative   Bilirubin, UA Negative    Ketones, UA Negative    Spec Grav, UA 1.015 1.010 - 1.025   Blood, UA trace    pH, UA 6.5 5.0 - 8.0   Protein, UA Negative Negative   Urobilinogen, UA 0.2 0.2 or 1.0 E.U./dL   Nitrite, UA Negative    Leukocytes, UA Moderate (2+) (A) Negative   Appearance     Odor      Assessment and Plan:   Mary Carlson was seen today for vaginal itching.  Diagnoses and all orders for this  visit:  Leukocytes in urine Urine with leukocytes. No significant UTI symptoms, will await urine culture for formal treatment. Follow up based on results. -     POCT urinalysis dipstick -     Urine Culture; Future -     Urine Culture  Screening examination for STD (sexually transmitted disease) Update STD panel today. Will treat based on results. Encouraged condom use. -     RPR; Future -     HIV Antibody (routine testing w rflx); Future -     Cervicovaginal ancillary only( [place]) -     HIV Antibody (routine testing w rflx) -     RPR  Skin lesion Possible skin tag? Will refer to dermatology for further evaluation and management. -     Ambulatory referral to Dermatology  Other orders -     mupirocin cream (BACTROBAN) 2 %; Apply to affected area 1-2 times daily  CMA or LPN served as scribe during this visit. History, Physical, and Plan performed by medical provider. The above documentation has been reviewed and is accurate and complete.  Jarold Motto, PA-C

## 2020-06-23 NOTE — Patient Instructions (Addendum)
It was great to see you!  I'm glad you are doing well!  I will be in touch with all of your results as soon as they return.  Referral for dermatology will be placed today.  Trial the ointment in the area to see if it helps your bump -- dermatology can look at this if it does not resolve.  Take care,  Jarold Motto PA-C

## 2020-06-24 LAB — RPR: RPR Ser Ql: NONREACTIVE

## 2020-06-24 LAB — HIV ANTIBODY (ROUTINE TESTING W REFLEX): HIV 1&2 Ab, 4th Generation: NONREACTIVE

## 2020-06-24 LAB — URINE CULTURE
MICRO NUMBER:: 11343404
Result:: NO GROWTH
SPECIMEN QUALITY:: ADEQUATE

## 2020-06-25 LAB — CERVICOVAGINAL ANCILLARY ONLY
Bacterial Vaginitis (gardnerella): POSITIVE — AB
Candida Glabrata: NEGATIVE
Candida Vaginitis: NEGATIVE
Chlamydia: NEGATIVE
Comment: NEGATIVE
Comment: NEGATIVE
Comment: NEGATIVE
Comment: NEGATIVE
Comment: NEGATIVE
Comment: NORMAL
Neisseria Gonorrhea: NEGATIVE
Trichomonas: NEGATIVE

## 2020-06-28 ENCOUNTER — Other Ambulatory Visit: Payer: Self-pay | Admitting: Physician Assistant

## 2020-06-28 MED ORDER — METRONIDAZOLE 500 MG PO TABS: 500.0000 mg | ORAL_TABLET | Freq: Two times a day (BID) | ORAL | 0 refills | Status: DC

## 2020-08-11 ENCOUNTER — Encounter: Payer: Self-pay | Admitting: Physician Assistant

## 2020-08-11 ENCOUNTER — Other Ambulatory Visit (HOSPITAL_COMMUNITY)
Admission: RE | Admit: 2020-08-11 | Discharge: 2020-08-11 | Disposition: A | Discharge status: Home / Self Care | Payer: Self-pay | Source: Ambulatory Visit | Attending: Physician Assistant | Admitting: Physician Assistant

## 2020-08-11 ENCOUNTER — Ambulatory Visit (INDEPENDENT_AMBULATORY_CARE_PROVIDER_SITE_OTHER): Payer: BC Managed Care – PPO | Admitting: Physician Assistant

## 2020-08-11 ENCOUNTER — Other Ambulatory Visit: Payer: Self-pay

## 2020-08-11 VITALS — BP 120/70 | HR 80 | Temp 98.0°F | Resp 16 | Ht 65.0 in | Wt 245.0 lb

## 2020-08-11 DIAGNOSIS — Z975 Presence of (intrauterine) contraceptive device: Secondary | ICD-10-CM | POA: Diagnosis not present

## 2020-08-11 DIAGNOSIS — Z113 Encounter for screening for infections with a predominantly sexual mode of transmission: Secondary | ICD-10-CM | POA: Diagnosis not present

## 2020-08-11 DIAGNOSIS — F1010 Alcohol abuse, uncomplicated: Secondary | ICD-10-CM

## 2020-08-11 LAB — POCT URINE PREGNANCY: Preg Test, Ur: NEGATIVE

## 2020-08-11 NOTE — Progress Notes (Signed)
Mary Carlson is a 20 y.o. female here for a new problem.  History of Present Illness:   Chief Complaint  Patient presents with  . Exposure to STD    Patient states she needs STD testing. Having yellow discharge. Last visit she states the medication for BV caused dizziness-she stopped the medication. She states she has been reckless activity    HPI   Binge drinking Admits to significant binge drinking this weekend. She states that she is done drinking. Denies SI/HI. Does not want to discuss further.  STD concern She is having discharge, pelvic pain and itching to her vagina. Last had unprotected sex two weekends ago. Unsure if partner had any symptoms. Denies: nausea, vomiting, chills, dysuria. She has a nexplanon in place. She states that it is time for it to be removed and needs new gynecologist because her old one was let go. She reports that she did have a period for the first time a few weeks ago, was light but was her first period in about 2 years.   Wt Readings from Last 5 Encounters:  08/11/20 245 lb (111.1 kg)  06/23/20 257 lb 6.1 oz (116.7 kg)  08/16/19 261 lb 8 oz (118.6 kg)  04/02/19 240 lb 8 oz (109.1 kg) (>99 %, Z= 2.43)*  02/20/19 220 lb (99.8 kg) (99 %, Z= 2.21)*   * Growth percentiles are based on CDC (Girls, 2-20 Years) data.   No LMP recorded. Patient has had an implant.   Past Medical History:  Diagnosis Date  . Anxiety   . Depression   . History of chicken pox      Social History   Tobacco Use  . Smoking status: Current Every Day Smoker    Types: E-cigarettes  . Smokeless tobacco: Never Used  Vaping Use  . Vaping Use: Every day  Substance Use Topics  . Alcohol use: Yes    Alcohol/week: 15.0 standard drinks    Types: 15 Shots of liquor per week    Comment: 3 x's a week  . Drug use: Never    Past Surgical History:  Procedure Laterality Date  . TONSILLECTOMY N/A 03/02/2019    History reviewed. No pertinent family history.  Allergies   Allergen Reactions  . Food Itching    Walnuts   . Other Anaphylaxis and Other (See Comments)    Walnuts & other tree nuts  . Watermelon [Citrullus Vulgaris] Other (See Comments)    Itchy throat  . Banana Rash    Current Medications:   Current Outpatient Medications:  .  etonogestrel (NEXPLANON) 68 MG IMPL implant, , Disp: , Rfl:  .  Multiple Vitamins-Minerals (MULTI-VITAMIN GUMMIES PO), Take 2 each by mouth daily in the afternoon., Disp: , Rfl:  .  metroNIDAZOLE (FLAGYL) 500 MG tablet, Take 1 tablet (500 mg total) by mouth 2 (two) times daily. (Patient not taking: Reported on 08/11/2020), Disp: 14 tablet, Rfl: 0 .  mupirocin ointment (BACTROBAN) 2 %, Apply to affected area 1-2 times daily. (Patient not taking: Reported on 08/11/2020), Disp: 22 g, Rfl: 0   Review of Systems:   ROS  Negative unless otherwise specified per HPI.  Vitals:   Vitals:   08/11/20 1508  BP: 120/70  Pulse: 80  Resp: 16  Temp: 98 F (36.7 C)  TempSrc: Temporal  SpO2: 98%  Weight: 245 lb (111.1 kg)  Height: 5\' 5"  (1.651 m)     Body mass index is 40.77 kg/m.  Physical Exam:   Physical Exam Vitals  and nursing note reviewed. Exam conducted with a chaperone present (Patina).  Constitutional:      General: She is not in acute distress.    Appearance: She is well-developed. She is not ill-appearing, toxic-appearing or sickly-appearing.  Cardiovascular:     Rate and Rhythm: Normal rate and regular rhythm.     Pulses: Normal pulses.     Heart sounds: Normal heart sounds, S1 normal and S2 normal.     Comments: No LE edema Pulmonary:     Effort: Pulmonary effort is normal.     Breath sounds: Normal breath sounds.  Genitourinary:    Comments: Tenderness at introitus; no CMT; slight vaginal discharge, clear Skin:    General: Skin is warm, dry and intact.  Neurological:     Mental Status: She is alert.     GCS: GCS eye subscore is 4. GCS verbal subscore is 5. GCS motor subscore is 6.  Psychiatric:         Mood and Affect: Mood and affect normal.        Speech: Speech normal.        Behavior: Behavior normal. Behavior is cooperative.        Thought Content: Thought content normal.     Results for orders placed or performed in visit on 08/11/20  POCT urine pregnancy  Result Value Ref Range   Preg Test, Ur Negative Negative    Assessment and Plan:   Zandria was seen today for exposure to std.  Diagnoses and all orders for this visit:  Alcohol consumption binge drinking Denies concerns and would not like to discuss today. Provided emotional support. She does report that she feels safe at home.  Screening examination for STD (sexually transmitted disease) UPT negative. Will start oral doxycycline. Exam is not completely consistently with PID but she was quite tender at introitus. She refuses abx injection today and states that she cannot tolerate oral flagyl due to side effects. Cervical swab obtained as well as HIV and RPR testing. Follow-up based on blood work and clinical symptoms. Encouraged condom use. -     HIV Antibody (routine testing w rflx) -     RPR -     Cervicovaginal ancillary only( [place]) -     POCT urine pregnancy  Implanon in place Referral to gynecology for removal and further management. -     Ambulatory referral to Obstetrics / Gynecology  Time spent with patient today was 25 minutes which consisted of chart review, discussing diagnosis, work up, treatment answering questions and documentation.   Jarold Motto, PA-C

## 2020-08-12 LAB — HIV ANTIBODY (ROUTINE TESTING W REFLEX): HIV 1&2 Ab, 4th Generation: NONREACTIVE

## 2020-08-12 LAB — RPR: RPR Ser Ql: NONREACTIVE

## 2020-08-13 ENCOUNTER — Other Ambulatory Visit: Payer: Self-pay | Admitting: Physician Assistant

## 2020-08-13 LAB — CERVICOVAGINAL ANCILLARY ONLY
Bacterial Vaginitis (gardnerella): POSITIVE — AB
Candida Glabrata: NEGATIVE
Candida Vaginitis: NEGATIVE
Chlamydia: NEGATIVE
Comment: NEGATIVE
Comment: NEGATIVE
Comment: NEGATIVE
Comment: NEGATIVE
Comment: NEGATIVE
Comment: NORMAL
Neisseria Gonorrhea: NEGATIVE
Trichomonas: NEGATIVE

## 2020-08-13 MED ORDER — METRONIDAZOLE 0.75 % VA GEL: 1.0000 | Freq: Every day | VAGINAL | 0 refills | Status: AC

## 2020-09-17 IMAGING — US US ABDOMEN LIMITED
1 series · 14 of 25 positions shown · non-contrast
Comparison: None.

CLINICAL DATA: Right flank pain.

EXAM:
ULTRASOUND ABDOMEN LIMITED RIGHT UPPER QUADRANT

[Series 1: us abdomen limited · 14 of 41 slices shown]
[im 1/41]
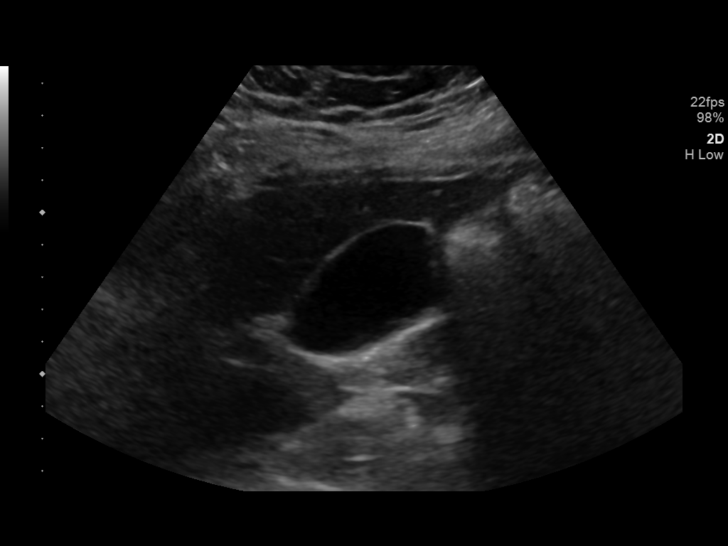
[im 4/41]
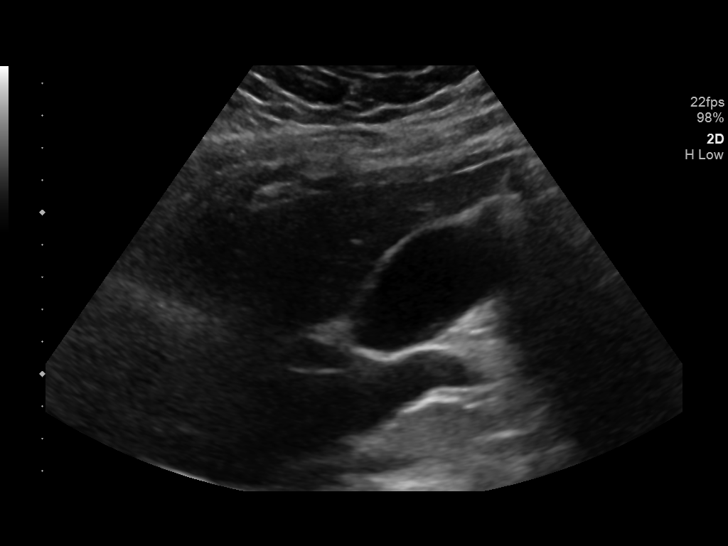
[im 7/41]
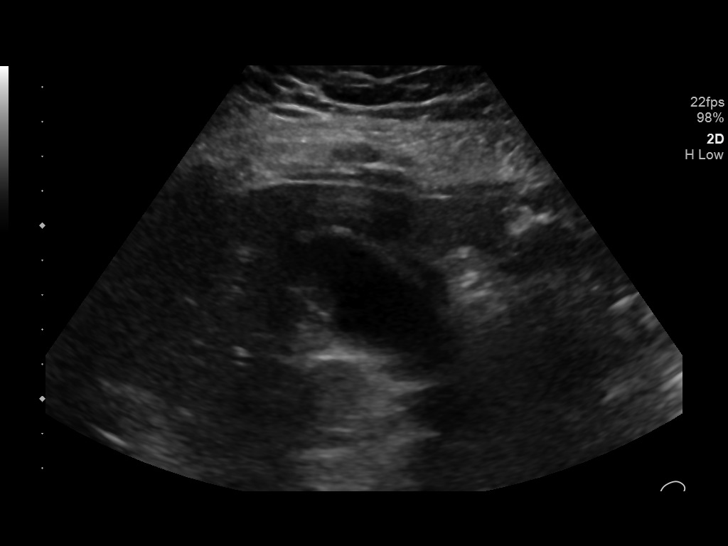
[im 11/41]
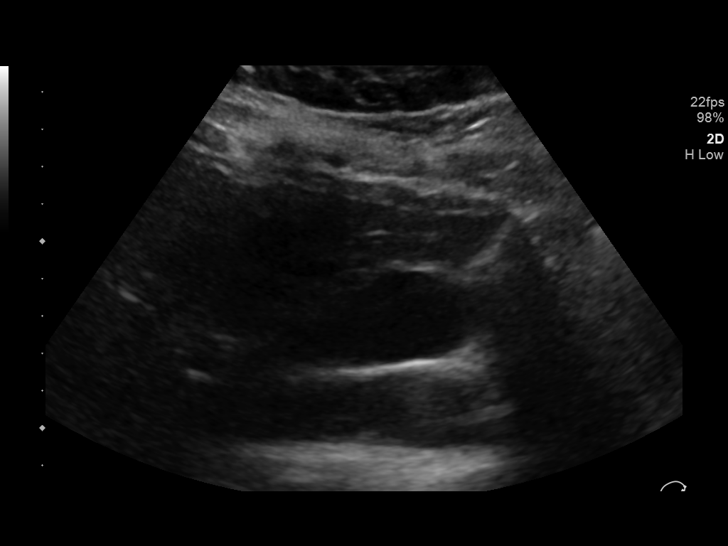
[im 14/41]
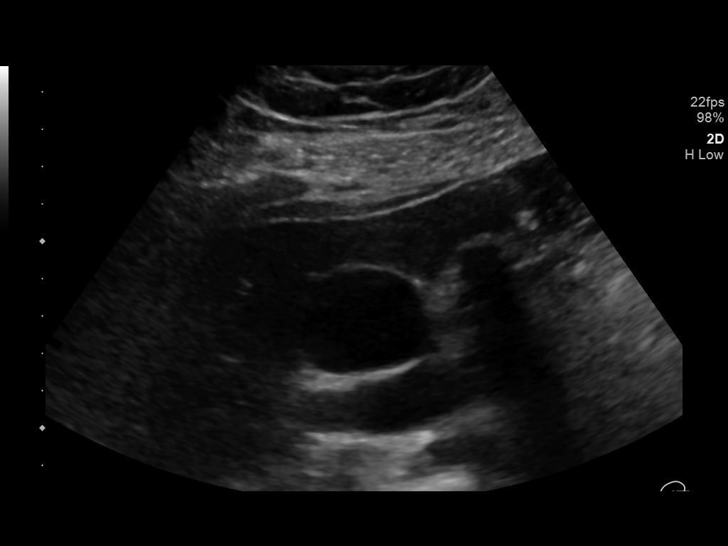
[im 16/41]
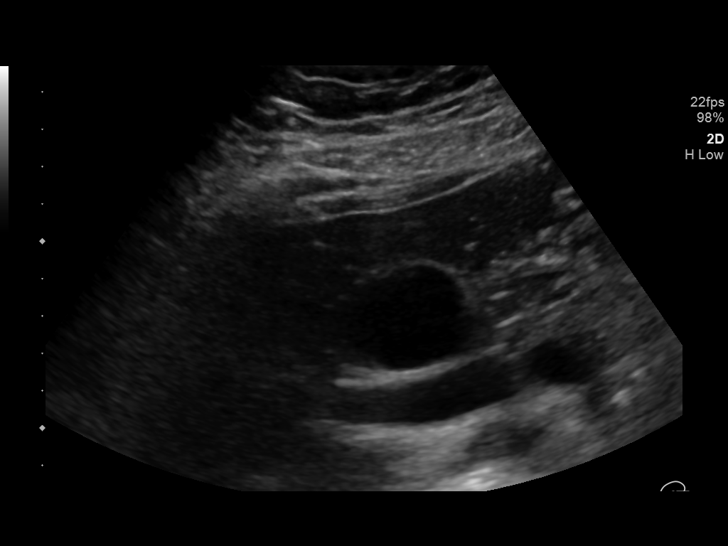
[im 19/41]
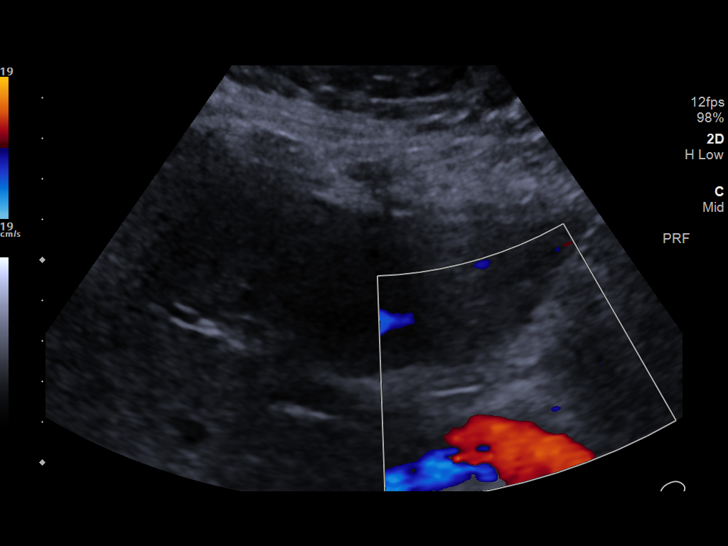
[im 22/41]
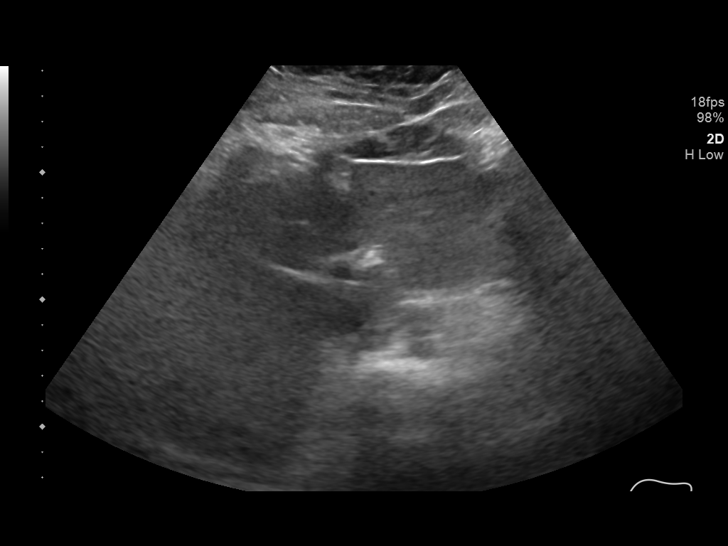
[im 26/41]
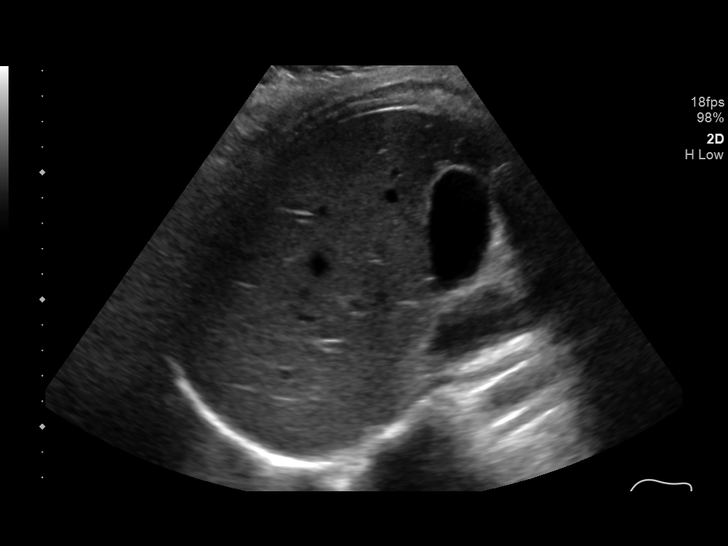
[im 27/41]
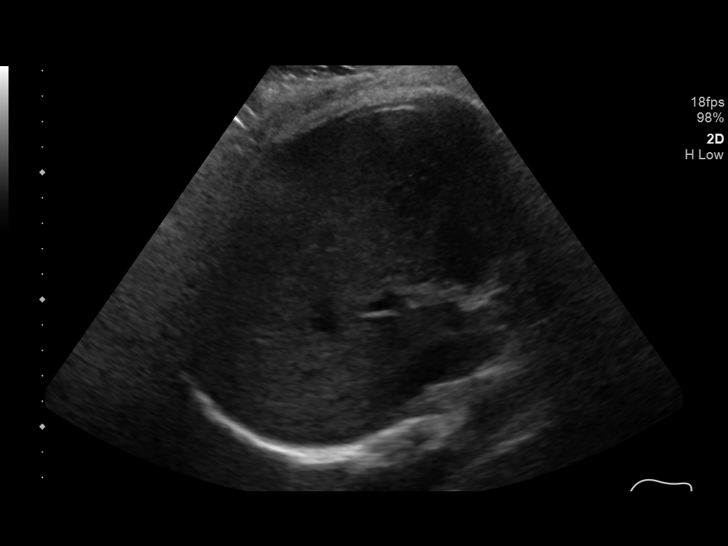
[im 31/41]
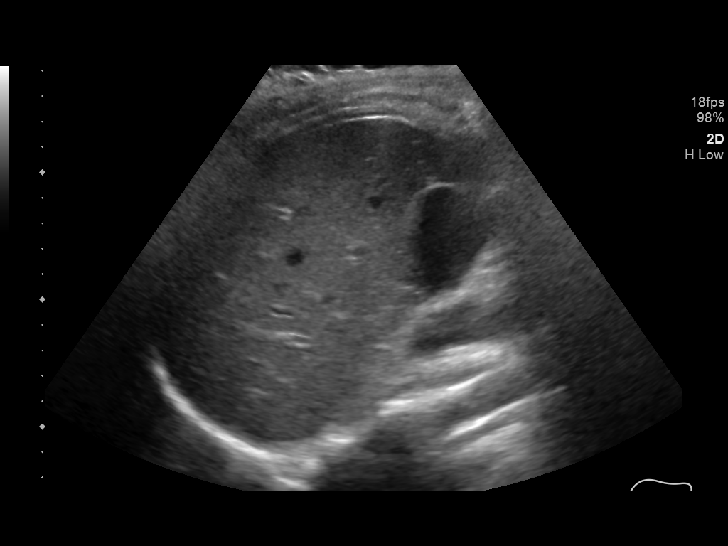
[im 34/41]
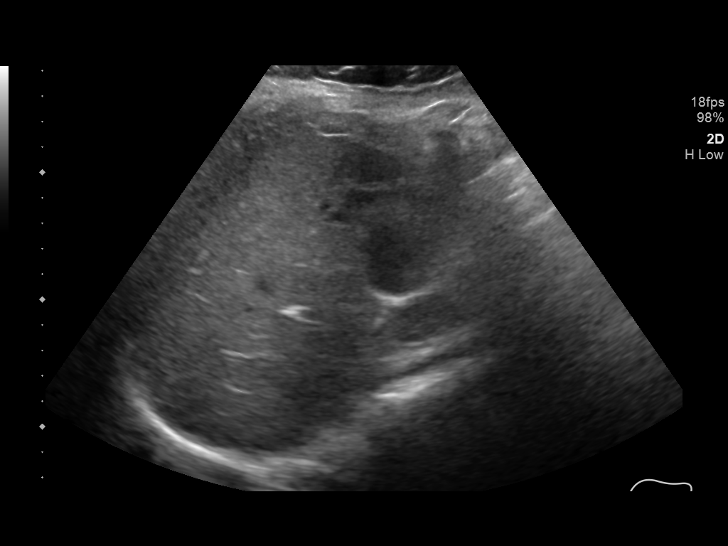
[im 37/41]
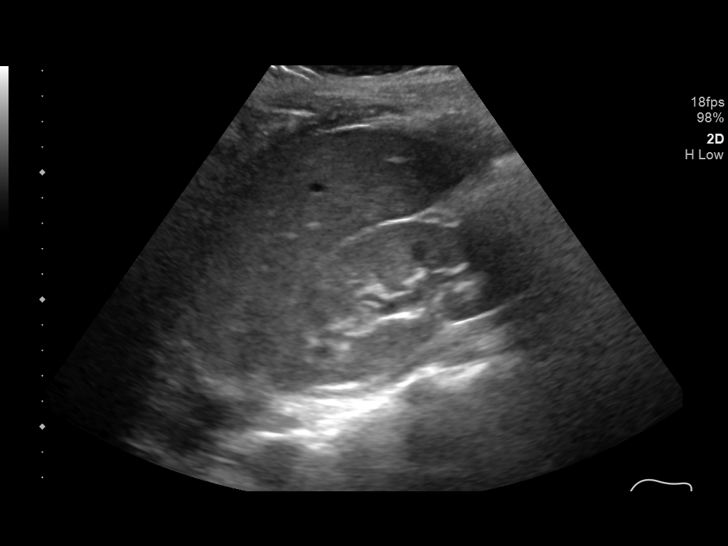
[im 41/41]
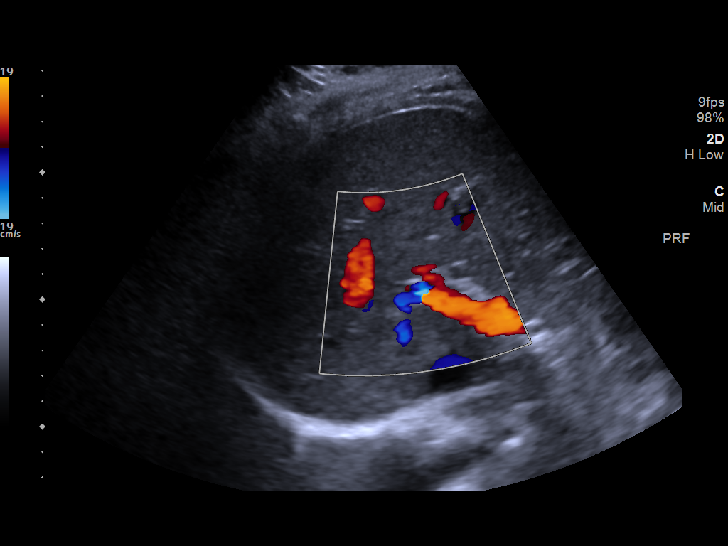

[14 of 25 positions shown; findings below may reference images not displayed]

FINDINGS: Gallbladder:

No gallstones or wall thickening visualized. No sonographic Murphy
sign noted by sonographer.

Common bile duct:

Diameter: 2 mm, within normal limits

Liver:

No focal lesion identified. Within normal limits in parenchymal
echogenicity. Portal vein is patent on color Doppler imaging with
normal direction of blood flow towards the liver.
IMPRESSION: Negative right upper quadrant ultrasound.

## 2020-09-17 IMAGING — US US RENAL
1 series · 14 of 25 positions shown · non-contrast
Comparison: None.

CLINICAL DATA: Acute onset of right flank pain this morning.

EXAM:
RENAL / URINARY TRACT ULTRASOUND COMPLETE

[Series 1: us renal · 14 of 47 slices shown]
[im 1/47]
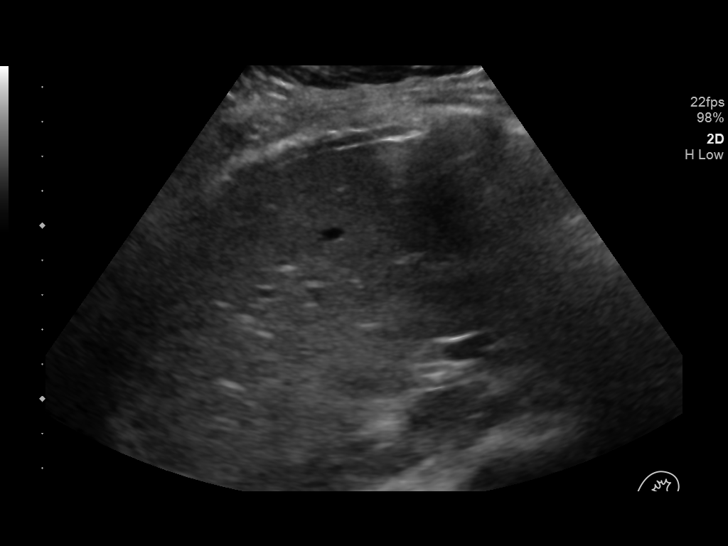
[im 4/47]
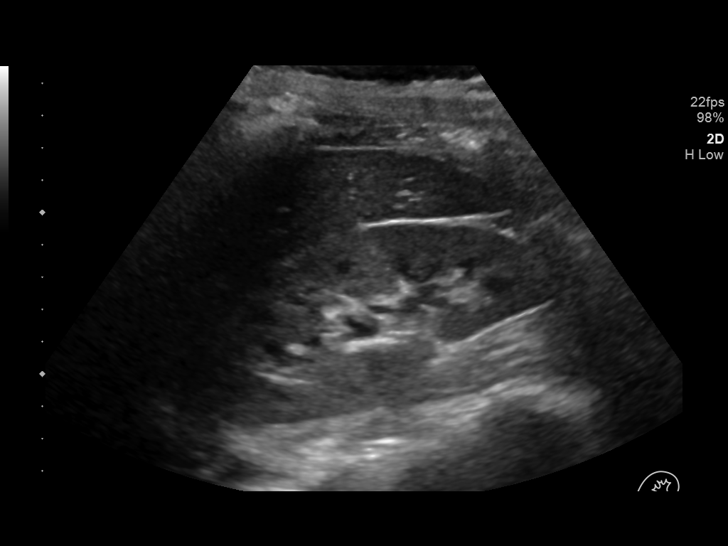
[im 8/47]
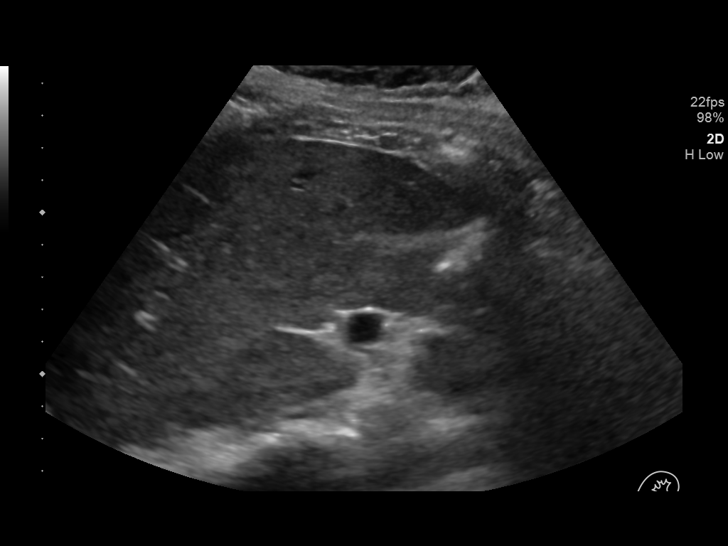
[im 12/47]
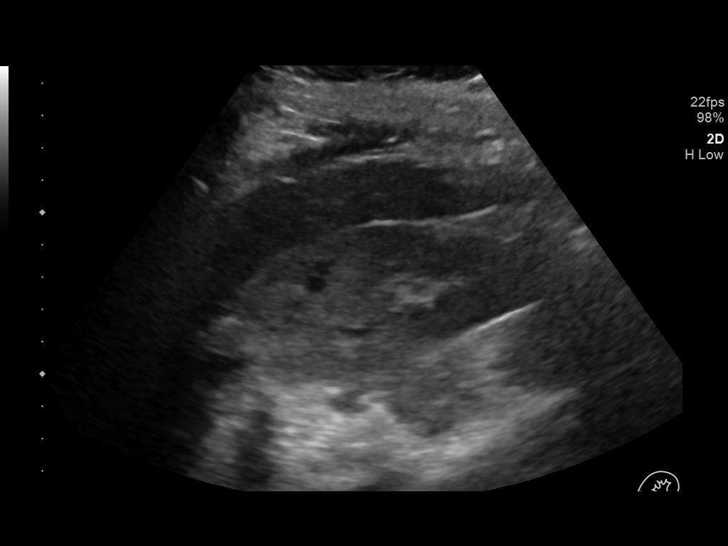
[im 16/47]
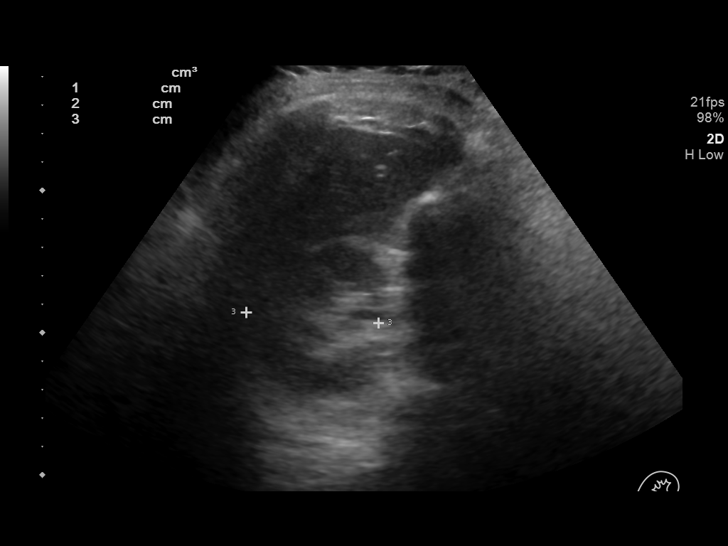
[im 18/47]
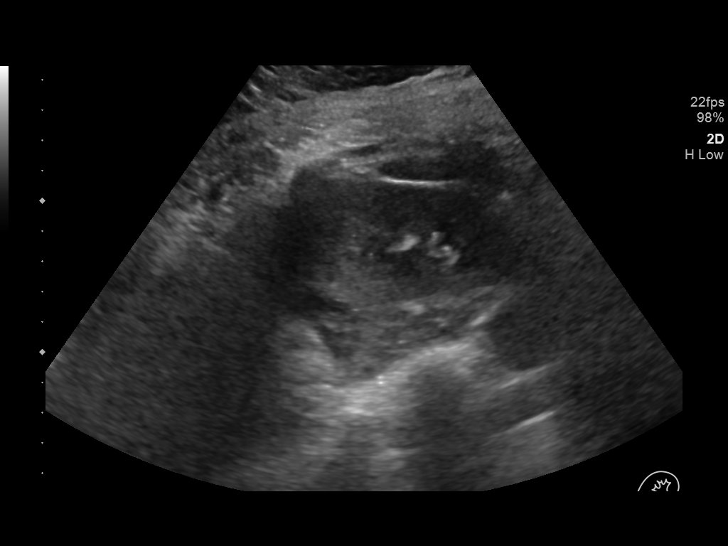
[im 22/47]
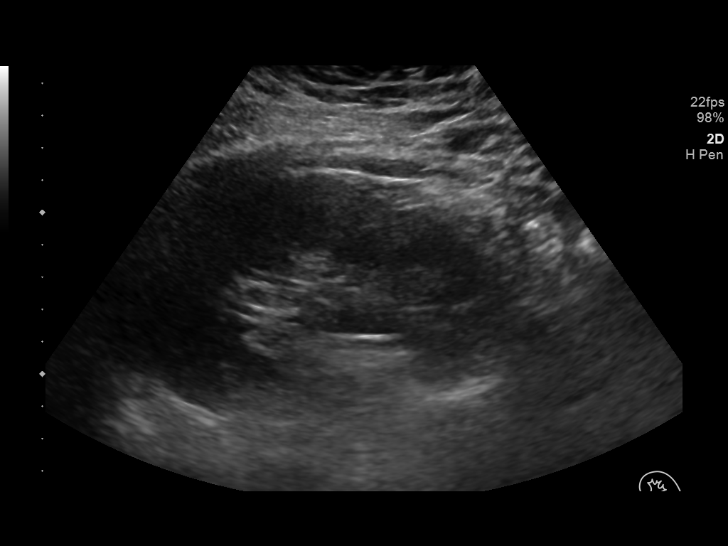
[im 25/47]
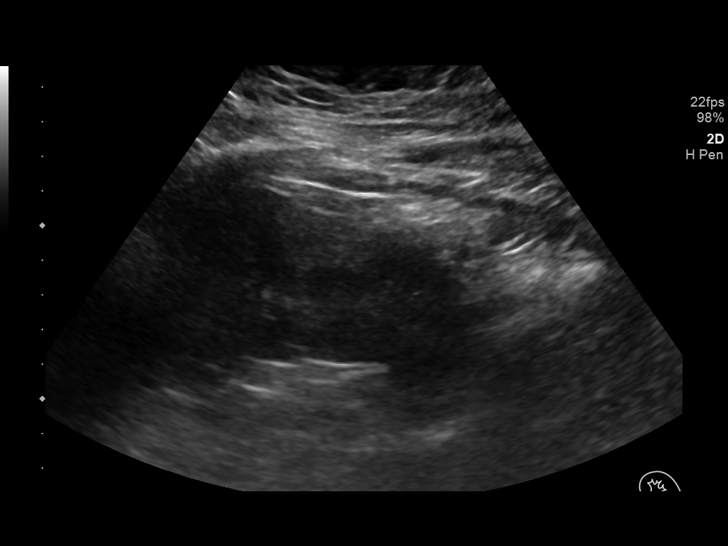
[im 29/47]
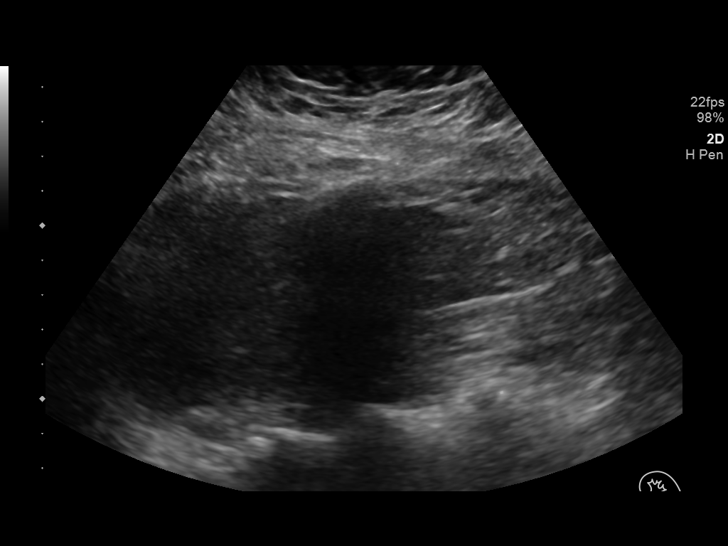
[im 31/47]
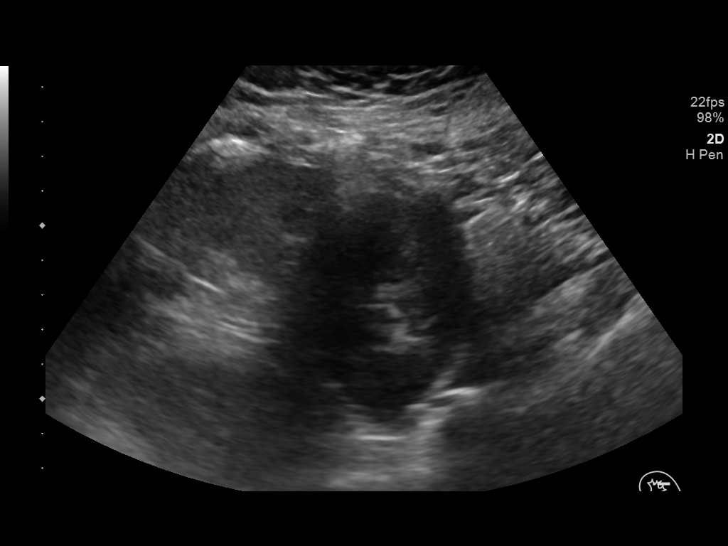
[im 35/47]
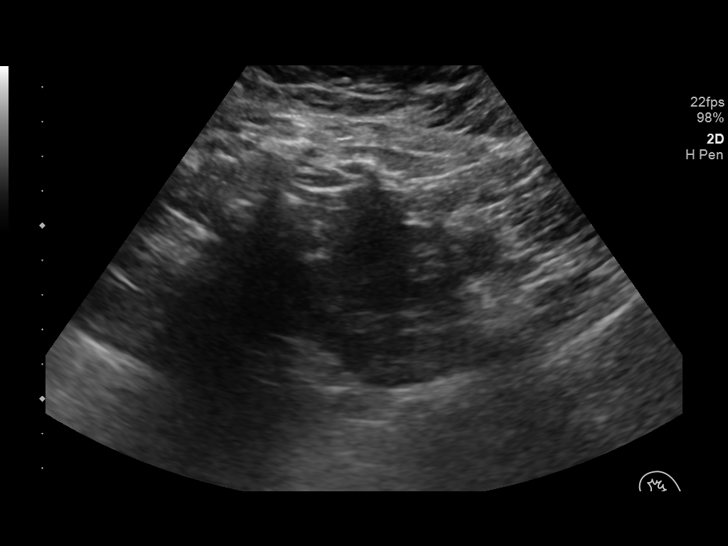
[im 39/47]
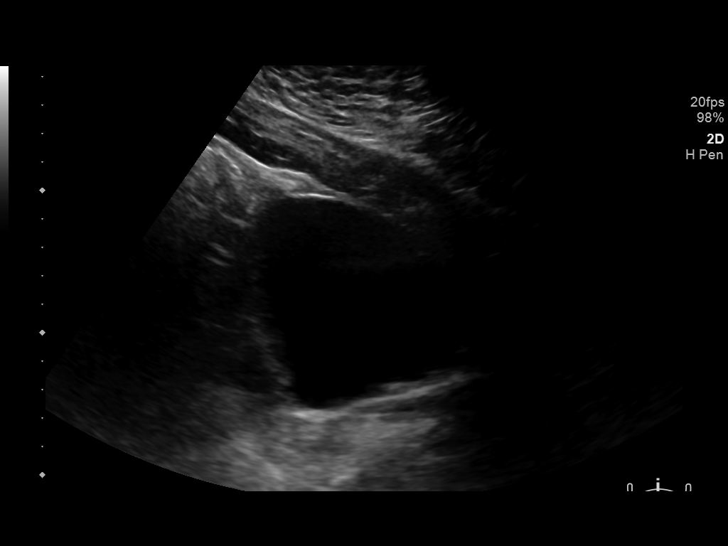
[im 43/47]
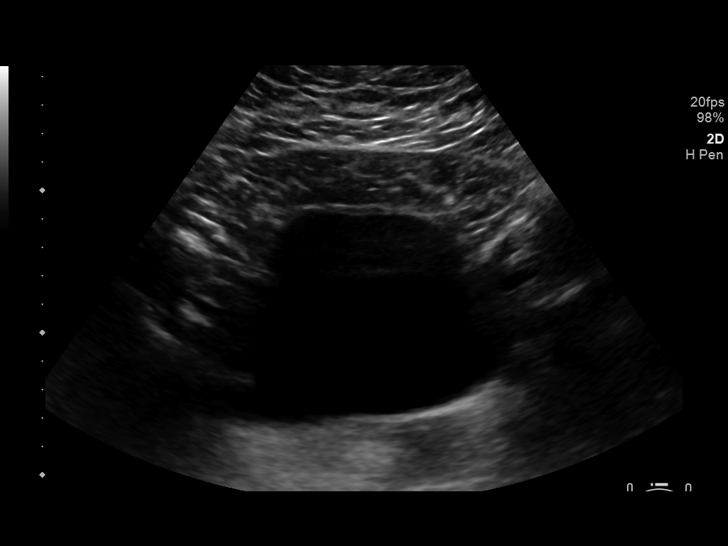
[im 47/47]
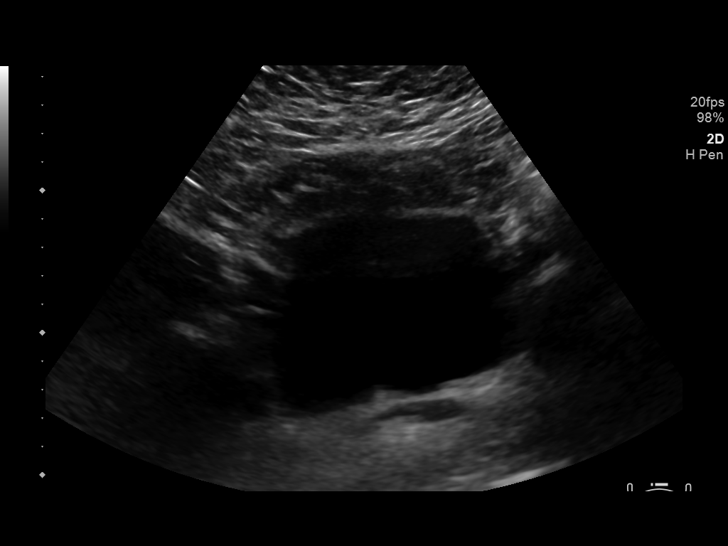

[14 of 25 positions shown; findings below may reference images not displayed]

FINDINGS: Right Kidney:

Renal measurements: 12.7 x 4.9 x 4.7 cm = volume: 151 mL .
Echogenicity within normal limits. No mass or hydronephrosis
visualized.

Left Kidney:

Renal measurements: 10.9 x 5.0 x 5.0 cm = volume: 141 mL.
Echogenicity within normal limits. No mass or hydronephrosis
visualized.

Bladder:

Appears normal for degree of bladder distention.
IMPRESSION: Negative. No evidence of hydronephrosis or other significant
abnormality.

## 2020-09-25 ENCOUNTER — Other Ambulatory Visit (HOSPITAL_COMMUNITY)
Admission: RE | Admit: 2020-09-25 | Discharge: 2020-09-25 | Disposition: A | Discharge status: Home / Self Care | Payer: BC Managed Care – PPO | Source: Ambulatory Visit | Attending: Physician Assistant | Admitting: Physician Assistant

## 2020-09-25 ENCOUNTER — Encounter: Payer: Self-pay | Admitting: Physician Assistant

## 2020-09-25 ENCOUNTER — Other Ambulatory Visit: Payer: Self-pay

## 2020-09-25 ENCOUNTER — Ambulatory Visit (INDEPENDENT_AMBULATORY_CARE_PROVIDER_SITE_OTHER): Payer: BC Managed Care – PPO | Admitting: Physician Assistant

## 2020-09-25 VITALS — BP 126/68 | HR 100 | Temp 98.2°F | Ht 65.0 in | Wt 233.2 lb

## 2020-09-25 DIAGNOSIS — N760 Acute vaginitis: Secondary | ICD-10-CM | POA: Insufficient documentation

## 2020-09-25 DIAGNOSIS — Z113 Encounter for screening for infections with a predominantly sexual mode of transmission: Secondary | ICD-10-CM | POA: Insufficient documentation

## 2020-09-25 DIAGNOSIS — B9689 Other specified bacterial agents as the cause of diseases classified elsewhere: Secondary | ICD-10-CM | POA: Insufficient documentation

## 2020-09-25 LAB — POCT URINE PREGNANCY: Preg Test, Ur: NEGATIVE

## 2020-09-25 NOTE — Progress Notes (Signed)
Acute Office Visit  Subjective:    Patient ID: Mary Carlson, female    DOB: Jun 08, 2001, 20 y.o.   MRN: [id number]  Chief Complaint  Patient presents with  . Exposure to STD    Discharge    HPI Patient is in today for possible STD exposure. Last had intercourse almost one week ago without condoms, new partner. Now having new discharge. Yellow with odor. Some itching. Some discomfort with intercourse. No pelvic or suprapubic pain currently. Unsure of LMP due to irregularity with Nexplanon. No N/V. No fevers.  Past Medical History:  Diagnosis Date  . Anxiety   . Depression   . History of chicken pox     Past Surgical History:  Procedure Laterality Date  . TONSILLECTOMY N/A 03/02/2019    History reviewed. No pertinent family history.  Social History   Socioeconomic History  . Marital status: Single    Spouse name: Not on file  . Number of children: Not on file  . Years of education: Not on file  . Highest education level: Not on file  Occupational History  . Not on file  Tobacco Use  . Smoking status: Current Every Day Smoker    Types: E-cigarettes  . Smokeless tobacco: Never Used  Vaping Use  . Vaping Use: Every day  Substance and Sexual Activity  . Alcohol use: Yes    Alcohol/week: 15.0 standard drinks    Types: 15 Shots of liquor per week    Comment: 3 x's a week  . Drug use: Never  . Sexual activity: Yes    Birth control/protection: Implant  Other Topics Concern  . Not on file  Social History Narrative  . Not on file   Social Determinants of Health   Financial Resource Strain: Not on file  Food Insecurity: Not on file  Transportation Needs: Not on file  Physical Activity: Not on file  Stress: Not on file  Social Connections: Not on file  Intimate Partner Violence: Not on file    Outpatient Medications Prior to Visit  Medication Sig Dispense Refill  . etonogestrel (NEXPLANON) 68 MG IMPL implant     . Multiple Vitamins-Minerals (MULTI-VITAMIN  GUMMIES PO) Take 2 each by mouth daily in the afternoon.    . mupirocin ointment (BACTROBAN) 2 % Apply to affected area 1-2 times daily. (Patient not taking: Reported on 08/11/2020) 22 g 0   No facility-administered medications prior to visit.    Allergies  Allergen Reactions  . Food Itching    Walnuts   . Other Anaphylaxis and Other (See Comments)    Walnuts & other tree nuts  . Flagyl [Metronidazole] Other (See Comments)    Dizziness   . Watermelon [Citrullus Vulgaris] Other (See Comments)    Itchy throat  . Banana Rash    Review of Systems REFER TO HPI FOR PERTINENT POSITIVES AND NEGATIVES     Objective:    Physical Exam Exam conducted with a chaperone present.  Constitutional:      Appearance: She is obese.  Abdominal:     General: Abdomen is flat. Bowel sounds are normal. There is no distension.     Palpations: Abdomen is soft.     Tenderness: There is no abdominal tenderness.  Genitourinary:    General: Normal vulva.     Pubic Area: No rash.      Labia:        Right: No rash, tenderness or lesion.        Left: No rash,  tenderness or lesion.      Vagina: Normal.     Cervix: Erythema present.     Uterus: Normal.   Neurological:     Mental Status: She is alert.     BP 126/68   Pulse 100   Temp 98.2 F (36.8 C)   Ht 5\' 5"  (1.651 m)   Wt 233 lb 3.2 oz (105.8 kg)   SpO2 97%   BMI 38.81 kg/m  Wt Readings from Last 3 Encounters:  09/25/20 233 lb 3.2 oz (105.8 kg)  08/11/20 245 lb (111.1 kg)  06/23/20 257 lb 6.1 oz (116.7 kg)    Health Maintenance Due  Topic Date Due  . PAP-Cervical Cytology Screening  Never done  . PAP SMEAR-Modifier  Never done  . COVID-19 Vaccine (3 - Booster for Pfizer series) 08/06/2020    There are no preventive care reminders to display for this patient.   Lab Results  Component Value Date   TSH 2.04 08/16/2019   Lab Results  Component Value Date   WBC 8.7 12/28/2019   HGB 14.6 12/28/2019   HCT 46.2 (H) 12/28/2019    MCV 91.3 12/28/2019   PLT 262 12/28/2019   Lab Results  Component Value Date   NA 136 12/28/2019   K 4.3 12/28/2019   CO2 21 (L) 12/28/2019   GLUCOSE 116 (H) 12/28/2019   BUN 12 12/28/2019   CREATININE 0.85 12/28/2019   BILITOT 0.3 12/28/2019   ALKPHOS 70 12/28/2019   AST 29 12/28/2019   ALT 33 12/28/2019   PROT 8.6 (H) 12/28/2019   ALBUMIN 4.3 12/28/2019   CALCIUM 9.4 12/28/2019   ANIONGAP 10 12/28/2019   GFR 104.76 08/16/2019   Lab Results  Component Value Date   CHOL 175 05/08/2019   Lab Results  Component Value Date   HDL 62 05/08/2019   Lab Results  Component Value Date   LDLCALC 87 05/08/2019   Lab Results  Component Value Date   TRIG 132 05/08/2019   Lab Results  Component Value Date   CHOLHDL 2.8 05/08/2019   Lab Results  Component Value Date   HGBA1C 6.1 (H) 05/08/2019       Assessment & Plan:   Problem List Items Addressed This Visit   None   Visit Diagnoses    Screening examination for STD (sexually transmitted disease)    -  Primary   Relevant Orders   Cervicovaginal ancillary only( [place])   POCT urine pregnancy (Completed)   Acute vaginitis       Relevant Orders   Cervicovaginal ancillary only( [place])   POCT urine pregnancy (Completed)     1. Screening examination for STD (sexually transmitted disease) 2. Acute vaginitis Swab today, will call with results and treat accordingly. Safe sex counseling with patient today. UPT was negative in office.   This visit occurred during the SARS-CoV-2 public health emergency.  Safety protocols were in place, including screening questions prior to the visit, additional usage of staff PPE, and extensive cleaning of exam room while observing appropriate contact time as indicated for disinfecting solutions.    Tiger Spieker M Casara Perrier, PA-C

## 2020-09-25 NOTE — Patient Instructions (Addendum)
Please call your GYN about Nexplanon and need for pap smear. I will call with your swab results and we will treat accordingly.  You need to practice safe sex or consider abstinence altogether.

## 2020-09-28 LAB — CERVICOVAGINAL ANCILLARY ONLY
Bacterial Vaginitis (gardnerella): POSITIVE — AB
Candida Glabrata: NEGATIVE
Candida Vaginitis: NEGATIVE
Chlamydia: NEGATIVE
Comment: NEGATIVE
Comment: NEGATIVE
Comment: NEGATIVE
Comment: NEGATIVE
Comment: NEGATIVE
Comment: NORMAL
Neisseria Gonorrhea: NEGATIVE
Trichomonas: NEGATIVE

## 2020-09-29 ENCOUNTER — Other Ambulatory Visit: Payer: Self-pay | Admitting: Physician Assistant

## 2020-09-29 MED ORDER — METRONIDAZOLE 0.75 % VA GEL: 1.0000 | Freq: Every day | VAGINAL | 0 refills | Status: DC

## 2020-10-06 ENCOUNTER — Encounter: Payer: Self-pay | Admitting: Physician Assistant

## 2020-10-06 ENCOUNTER — Telehealth (INDEPENDENT_AMBULATORY_CARE_PROVIDER_SITE_OTHER): Payer: BC Managed Care – PPO | Admitting: Physician Assistant

## 2020-10-06 ENCOUNTER — Other Ambulatory Visit (HOSPITAL_COMMUNITY)
Admission: RE | Admit: 2020-10-06 | Discharge: 2020-10-06 | Disposition: A | Discharge status: Home / Self Care | Payer: BC Managed Care – PPO | Source: Ambulatory Visit | Attending: Physician Assistant | Admitting: Physician Assistant

## 2020-10-06 VITALS — Ht 65.0 in | Wt 230.0 lb

## 2020-10-06 DIAGNOSIS — Z202 Contact with and (suspected) exposure to infections with a predominantly sexual mode of transmission: Secondary | ICD-10-CM | POA: Diagnosis present

## 2020-10-06 DIAGNOSIS — J029 Acute pharyngitis, unspecified: Secondary | ICD-10-CM

## 2020-10-06 NOTE — Progress Notes (Signed)
Virtual Visit via Video   I connected with Mary Carlson on 10/06/20 at 12:00 PM EDT by a video enabled telemedicine application and verified that I am speaking with the correct person using two identifiers. Location patient: Home Location provider: [facility], Office Persons participating in the virtual visit: Sherrilyn Nairn, Jarold Motto PA-C, Corky Mull, LPN   I discussed the limitations of evaluation and management by telemedicine and the availability of in person appointments. The patient expressed understanding and agreed to proceed.  I acted as a Neurosurgeon for [employer], [employer], LPN   Subjective:   HPI:    Sore throat Pt c/o sore throat started 2-3 days ago. Pt also having nausea and headache. Taking Tylenol with relief. Pt said her and her friend were sharing drinks, this friend is currently at the doctor now. Patient has had her tonsils removed. Denies: fever, chills, n/v.   STD concerns Was seen by my colleague on 09/25/20. Started on metrogel for BV but reports that she had unprotected sex with a new partner while she was on treatment. She has completed treatment but still has vaginal discharge. Denies: pelvic pain, unusual vaginal bleeding, urinary concerns.   ROS: See pertinent positives and negatives per HPI.  Patient Active Problem List   Diagnosis Date Noted  . MDD (major depressive disorder), recurrent episode, severe (HCC) 12/27/2019  . Severe recurrent major depression without psychotic features (HCC) 05/06/2019    Social History   Tobacco Use  . Smoking status: Current Every Day Smoker    Types: E-cigarettes  . Smokeless tobacco: Never Used  Substance Use Topics  . Alcohol use: Yes    Alcohol/week: 15.0 standard drinks    Types: 15 Shots of liquor per week    Comment: 3 x's a week    Current Outpatient Medications:  .  etonogestrel (NEXPLANON) 68 MG IMPL implant, , Disp: , Rfl:  .  Multiple Vitamins-Minerals  (MULTI-VITAMIN GUMMIES PO), Take 2 each by mouth daily in the afternoon., Disp: , Rfl:   Allergies  Allergen Reactions  . Food Itching    Walnuts   . Other Anaphylaxis and Other (See Comments)    Walnuts & other tree nuts  . Flagyl [Metronidazole] Other (See Comments)    Dizziness   . Watermelon [Citrullus Vulgaris] Other (See Comments)    Itchy throat  . Banana Rash    Objective:   VITALS: Per patient if applicable, see vitals. GENERAL: Alert, appears well and in no acute distress. HEENT: Atraumatic, conjunctiva clear, no obvious abnormalities on inspection of external nose and ears. NECK: Normal movements of the head and neck. CARDIOPULMONARY: No increased WOB. Speaking in clear sentences. I:E ratio WNL.  MS: Moves all visible extremities without noticeable abnormality. PSYCH: Pleasant and cooperative, well-groomed. Speech normal rate and rhythm. Affect is appropriate. Insight and judgement are appropriate. Attention is focused, linear, and appropriate.  NEURO: CN grossly intact. Oriented as arrived to appointment on time with no prompting. Moves both UE equally.  SKIN: No obvious lesions, wounds, erythema, or cyanosis noted on face or hands.  Assessment and Plan:   Manha was seen today for sore throat.  Diagnoses and all orders for this visit:  STD exposure Due to new partner and unprotected sex since last visit, will update STD testing. She declined UPT today. She will come in today to self swab and update HIV/RPR blood testing. She understands condom use, encouraged this. Will treat based on results. -  Cervicovaginal ancillary only( [place]) -     HIV Antibody (routine testing w rflx) -     RPR  Pharyngitis, unspecified etiology No red flags on discussion. She declined swab for strep. She will let us know what her friend's test result comes back as. She would like to wait on those results to determine treatment. Worsening precautions advised. Consider  ibuprofen prn for throat pain/inflammation.  I discussed the assessment and treatment plan with the patient. The patient was provided an opportunity to ask questions and all were answered. The patient agreed with the plan and demonstrated an understanding of the instructions.   The patient was advised to call back or seek an in-person evaluation if the symptoms worsen or if the condition fails to improve as anticipated.   CMA or LPN served as scribe during this visit. History, Physical, and Plan performed by medical provider. The above documentation has been reviewed and is accurate and complete.  [place], [place] 10/06/2020

## 2020-10-07 ENCOUNTER — Other Ambulatory Visit: Payer: Self-pay | Admitting: Physician Assistant

## 2020-10-07 LAB — CERVICOVAGINAL ANCILLARY ONLY
Bacterial Vaginitis (gardnerella): POSITIVE — AB
Candida Glabrata: NEGATIVE
Candida Vaginitis: NEGATIVE
Chlamydia: NEGATIVE
Comment: NEGATIVE
Comment: NEGATIVE
Comment: NEGATIVE
Comment: NEGATIVE
Comment: NEGATIVE
Comment: NORMAL
Neisseria Gonorrhea: NEGATIVE
Trichomonas: NEGATIVE

## 2020-10-07 LAB — RPR: RPR Ser Ql: NONREACTIVE

## 2020-10-07 LAB — HIV ANTIBODY (ROUTINE TESTING W REFLEX): HIV 1&2 Ab, 4th Generation: NONREACTIVE

## 2020-10-07 MED ORDER — METRONIDAZOLE 0.75 % VA GEL: 1.0000 | Freq: Every day | VAGINAL | 0 refills | Status: AC

## 2020-11-11 ENCOUNTER — Telehealth: Payer: Self-pay

## 2020-11-11 NOTE — Telephone Encounter (Signed)
Nurse Assessment Nurse: Shon Baton, RN, Patrice Date/Time (Eastern Time): 11/11/2020 12:24:40 PM Confirm and document reason for call. If symptomatic, describe symptoms. ---Caller states she has diarrhea and stomach pains. Sick since yesterday. Having cramping in lower abdomen. No vomiting. Diarrhea x 3. No fever. Does the patient have any new or worsening symptoms? ---Yes Will a triage be completed? ---Yes Related visit to physician within the last 2 weeks? ---No Does the PT have any chronic conditions? (i.e. diabetes, asthma, this includes High risk factors for pregnancy, etc.) ---No Is the patient pregnant or possibly pregnant? (Ask all females between the ages of 3-55) ---No Is this a behavioral health or substance abuse call? ---No Guidelines Guideline Title Affirmed Question Affirmed Notes Nurse Date/Time (Eastern Time) Abdominal Pain - Female [1] MILDMODERATE pain AND [2] constant AND [3] present > 2 hours Shon Baton, RN, Rodell Perna 11/11/2020 12:27:45 PM PLEASE NOTE: All timestamps contained within this report are represented as [place] Standard Time. CONFIDENTIALTY NOTICE: This fax transmission is intended only for the addressee. It contains information that is legally privileged, confidential or otherwise protected from use or disclosure. If you are not the intended recipient, you are strictly prohibited from reviewing, disclosing, copying using or disseminating any of this information or taking any action in reliance on or regarding this information. If you have received this fax in error, please notify us immediately by telephone so that we can arrange for its return to [place]. Phone: [phone], Toll-Free: [phone], Fax: [phone] Page: 2 of 2 Call Id: [id number] Disp. Time Lamount Cohen Time) Disposition Final User 11/11/2020 12:14:02 PM Attempt made - message left Shon Baton, RN, Patrice 11/11/2020 12:34:04 PM See HCP within 4 Hours (or PCP triage) Yes Shon Baton, RN, Patrice Caller  Disagree/Comply Comply Caller Understands Yes PreDisposition Go to ED Care Advice Given Per Guideline SEE HCP (OR PCP TRIAGE) WITHIN 4 HOURS: * IF OFFICE WILL BE OPEN: You need to be seen within the next 3 or 4 hours. Call your doctor (or NP/PA) now or as soon as the office opens. REST: * Lie down and rest. * Do this until seen. NOTHING BY MOUTH: * Do not eat or drink anything for now. CALL BACK IF: * You become worse * UCC: Some UCCs can manage patients who are stable and have less serious symptoms (e.g., minor illnesses and injuries). The triager must know the [facility] capabilities before sending a patient there. If unsure, call ahead. Comments User: Jake Samples, RN Date/Time Lamount Cohen Time): 11/11/2020 12:35:21 PM Warm transfer to office Office states no available appts with in the time limit. patient instructed to go to Urgent care Caller verbalizes understanding. Referrals GO TO FACILITY UNDECIDED REFERRED TO PCP OFFIC

## 2020-11-12 NOTE — Telephone Encounter (Signed)
Left voice message for patient to call clinic.  

## 2021-04-15 ENCOUNTER — Ambulatory Visit: Payer: BC Managed Care – PPO | Admitting: Physician Assistant

## 2021-04-15 ENCOUNTER — Encounter: Payer: Self-pay | Admitting: Physician Assistant

## 2021-04-15 ENCOUNTER — Other Ambulatory Visit: Payer: Self-pay

## 2021-04-15 ENCOUNTER — Other Ambulatory Visit (HOSPITAL_COMMUNITY)
Admission: RE | Admit: 2021-04-15 | Discharge: 2021-04-15 | Disposition: A | Discharge status: Home / Self Care | Payer: BC Managed Care – PPO | Source: Ambulatory Visit | Attending: Physician Assistant | Admitting: Physician Assistant

## 2021-04-15 VITALS — BP 120/70 | HR 91 | Temp 97.7°F | Ht 65.0 in | Wt 244.5 lb

## 2021-04-15 DIAGNOSIS — Z113 Encounter for screening for infections with a predominantly sexual mode of transmission: Secondary | ICD-10-CM

## 2021-04-15 LAB — POCT URINE PREGNANCY: Preg Test, Ur: NEGATIVE

## 2021-04-15 NOTE — Addendum Note (Signed)
Addended by: Lorn Junes on: 04/15/2021 12:07 PM   Modules accepted: Orders

## 2021-04-15 NOTE — Patient Instructions (Signed)
It was great to see you!  I will be in touch with your lab and swab results  Congrats on staying smoke free!  Take care,  Jarold Motto PA-C

## 2021-04-15 NOTE — Progress Notes (Signed)
Mary Carlson is a 20 y.o. female here for STD check.  I acted as a Neurosurgeon for [employer], PA-C Corky Mull, LPN   History of Present Illness:   Chief Complaint  Patient presents with   STD check    HPI  STD check Pt is requesting STD check today due to unprotected sex a couple times in the past few months. Denies any known/confirmed exposures. Last month got her nexplanon replaced. Pt c/o spotting yesterday x 1. Denies pain, vaginal discharge, odor.   Wt Readings from Last 4 Encounters:  04/15/21 244 lb 8 oz (110.9 kg)  10/06/20 230 lb (104.3 kg)  09/25/20 233 lb 3.2 oz (105.8 kg)  08/11/20 245 lb (111.1 kg)     Past Medical History:  Diagnosis Date   Anxiety    Depression    History of chicken pox      Social History   Tobacco Use   Smoking status: Former    Types: E-cigarettes    Quit date: 10/13/2020    Years since quitting: 0.5   Smokeless tobacco: Never  Vaping Use   Vaping Use: Every day  Substance Use Topics   Alcohol use: Yes    Alcohol/week: 15.0 standard drinks    Types: 15 Shots of liquor per week    Comment: 3 x's a week   Drug use: Never    Past Surgical History:  Procedure Laterality Date   TONSILLECTOMY N/A 03/02/2019    History reviewed. No pertinent family history.  Allergies  Allergen Reactions   Food Itching    Walnuts    Other Anaphylaxis and Other (See Comments)    Walnuts & other tree nuts   Flagyl [Metronidazole] Other (See Comments)    Dizziness    Watermelon [Citrullus Vulgaris] Other (See Comments)    Itchy throat   Banana Rash    Current Medications:   Current Outpatient Medications:    etonogestrel (NEXPLANON) 68 MG IMPL implant, , Disp: , Rfl:    Multiple Vitamins-Minerals (MULTI-VITAMIN GUMMIES PO), Take 2 each by mouth daily in the afternoon., Disp: , Rfl:    Review of Systems:   ROS Negative unless otherwise specified per HPI.   Vitals:   Vitals:   04/15/21 1135  BP: 120/70  Pulse: 91   Temp: 97.7 F (36.5 C)  TempSrc: Temporal  SpO2: 96%  Weight: 244 lb 8 oz (110.9 kg)  Height: 5\' 5"  (1.651 m)     Body mass index is 40.69 kg/m.  Physical Exam:   Physical Exam Exam conducted with a chaperone present.  Constitutional:      Appearance: Normal appearance. She is well-developed.  HENT:     Head: Normocephalic and atraumatic.  Eyes:     General: Lids are normal.     Extraocular Movements: Extraocular movements intact.     Conjunctiva/sclera: Conjunctivae normal.  Pulmonary:     Effort: Pulmonary effort is normal.  Genitourinary:    Comments: Patient refused speculum and bimanual exam  Musculoskeletal:        General: Normal range of motion.     Cervical back: Normal range of motion and neck supple.  Skin:    General: Skin is warm and dry.  Neurological:     Mental Status: She is alert and oriented to person, place, and time.  Psychiatric:        Attention and Perception: Attention and perception normal.        Mood and Affect: Mood normal.  Behavior: Behavior normal.        Thought Content: Thought content normal.        Judgment: Judgment normal.    Assessment and Plan:   Screening examination for STD (sexually transmitted disease) Will obtain STD testing today HIV and RPR blood work Urine pregnancy She refused bimanual and speculum exam, if symptoms worsen, will need full exam Recommend abstinence until results have returned  CMA or LPN served as scribe during this visit. History, Physical, and Plan performed by medical provider. The above documentation has been reviewed and is accurate and complete.  Jarold Motto, PA-C

## 2021-04-16 LAB — CERVICOVAGINAL ANCILLARY ONLY
Bacterial Vaginitis (gardnerella): NEGATIVE
Candida Glabrata: NEGATIVE
Candida Vaginitis: NEGATIVE
Chlamydia: NEGATIVE
Comment: NEGATIVE
Comment: NEGATIVE
Comment: NEGATIVE
Comment: NEGATIVE
Comment: NEGATIVE
Comment: NORMAL
Neisseria Gonorrhea: NEGATIVE
Trichomonas: NEGATIVE

## 2021-04-16 LAB — HIV ANTIBODY (ROUTINE TESTING W REFLEX): HIV 1&2 Ab, 4th Generation: NONREACTIVE

## 2021-04-16 LAB — RPR: RPR Ser Ql: NONREACTIVE

## 2021-04-27 ENCOUNTER — Other Ambulatory Visit: Payer: Self-pay

## 2021-04-27 ENCOUNTER — Other Ambulatory Visit (HOSPITAL_COMMUNITY)
Admission: RE | Admit: 2021-04-27 | Discharge: 2021-04-27 | Disposition: A | Discharge status: Home / Self Care | Payer: BC Managed Care – PPO | Source: Ambulatory Visit | Attending: Physician Assistant | Admitting: Physician Assistant

## 2021-04-27 ENCOUNTER — Ambulatory Visit (INDEPENDENT_AMBULATORY_CARE_PROVIDER_SITE_OTHER): Payer: BC Managed Care – PPO | Admitting: Physician Assistant

## 2021-04-27 ENCOUNTER — Encounter: Payer: Self-pay | Admitting: Physician Assistant

## 2021-04-27 VITALS — BP 120/70 | HR 77 | Temp 98.2°F | Ht 65.0 in | Wt 250.4 lb

## 2021-04-27 DIAGNOSIS — Z111 Encounter for screening for respiratory tuberculosis: Secondary | ICD-10-CM | POA: Diagnosis not present

## 2021-04-27 DIAGNOSIS — Z0001 Encounter for general adult medical examination with abnormal findings: Secondary | ICD-10-CM | POA: Diagnosis not present

## 2021-04-27 DIAGNOSIS — E669 Obesity, unspecified: Secondary | ICD-10-CM

## 2021-04-27 DIAGNOSIS — N898 Other specified noninflammatory disorders of vagina: Secondary | ICD-10-CM | POA: Diagnosis not present

## 2021-04-27 DIAGNOSIS — F332 Major depressive disorder, recurrent severe without psychotic features: Secondary | ICD-10-CM | POA: Diagnosis not present

## 2021-04-27 DIAGNOSIS — Z113 Encounter for screening for infections with a predominantly sexual mode of transmission: Secondary | ICD-10-CM | POA: Insufficient documentation

## 2021-04-27 LAB — CBC WITH DIFFERENTIAL/PLATELET
Basophils Absolute: 0 10*3/uL (ref 0.0–0.1)
Basophils Relative: 0.4 % (ref 0.0–3.0)
Eosinophils Absolute: 0.2 10*3/uL (ref 0.0–0.7)
Eosinophils Relative: 2.9 % (ref 0.0–5.0)
HCT: 40.9 % (ref 36.0–46.0)
Hemoglobin: 13.1 g/dL (ref 12.0–15.0)
Lymphocytes Relative: 28.8 % (ref 12.0–46.0)
Lymphs Abs: 2.4 10*3/uL (ref 0.7–4.0)
MCHC: 32.1 g/dL (ref 30.0–36.0)
MCV: 86.4 fl (ref 78.0–100.0)
Monocytes Absolute: 0.6 10*3/uL (ref 0.1–1.0)
Monocytes Relative: 7 % (ref 3.0–12.0)
Neutro Abs: 5.1 10*3/uL (ref 1.4–7.7)
Neutrophils Relative %: 60.9 % (ref 43.0–77.0)
Platelets: 240 10*3/uL (ref 150.0–400.0)
RBC: 4.74 Mil/uL (ref 3.87–5.11)
RDW: 13.8 % (ref 11.5–15.5)
WBC: 8.3 10*3/uL (ref 4.0–10.5)

## 2021-04-27 LAB — LIPID PANEL
Cholesterol: 158 mg/dL (ref 0–200)
HDL: 57.5 mg/dL (ref >39.00)
LDL Cholesterol: 88 mg/dL (ref 0–99)
NonHDL: 100.77
Total CHOL/HDL Ratio: 3
Triglycerides: 65 mg/dL (ref 0.0–149.0)
VLDL: 13 mg/dL (ref 0.0–40.0)

## 2021-04-27 LAB — COMPREHENSIVE METABOLIC PANEL
ALT: 20 U/L (ref 0–35)
AST: 20 U/L (ref 0–37)
Albumin: 4.7 g/dL (ref 3.5–5.2)
Alkaline Phosphatase: 72 U/L (ref 39–117)
BUN: 9 mg/dL (ref 6–23)
CO2: 26 mEq/L (ref 19–32)
Calcium: 9.8 mg/dL (ref 8.4–10.5)
Chloride: 103 mEq/L (ref 96–112)
Creatinine, Ser: 0.82 mg/dL (ref 0.40–1.20)
GFR: 101.92 mL/min (ref >60.00)
Glucose, Bld: 86 mg/dL (ref 70–99)
Potassium: 4.1 mEq/L (ref 3.5–5.1)
Sodium: 138 mEq/L (ref 135–145)
Total Bilirubin: 0.3 mg/dL (ref 0.2–1.2)
Total Protein: 8.1 g/dL (ref 6.0–8.3)

## 2021-04-27 NOTE — Patient Instructions (Addendum)
It was great to see you!  We will be in touch with your TB test results and when your form is completed.  Please go to the lab for blood work.   Our office will call you with your results unless you have chosen to receive results via MyChart.  If your blood work is normal we will follow-up each year for physicals and as scheduled for chronic medical problems.  If anything is abnormal we will treat accordingly and get you in for a follow-up.  Take care,  Lelon Mast

## 2021-04-27 NOTE — Progress Notes (Signed)
Subjective:    Mary Carlson is a 20 y.o. female and is here for a comprehensive physical exam.  HPI  Health Maintenance Due  Topic Date Due   Pneumococcal Vaccine 25-29 Years old (1 - PPSV23 if available, else PCV20) 06/14/2001   COVID-19 Vaccine (3 - Booster for Pfizer series) 03/31/2020   PAP-Cervical Cytology Screening  Never done   PAP SMEAR-Modifier  Never done    Acute Concerns: Vaginal Itching Today she shows concern for vaginal issues going on. She believes it has to do with her PH balance, but would like to undergo routine STD testing as well as blood work. She has been experiencing vaginal itching. Denies vaginal discharge or pain.   TB test -- needs this for work. Denies any symptoms/concerns.  Chronic Issues: Anxiety/Depression States she feels she has problems with her anger but she is able to manage it. Currently managing well. No SI/HI.   Health Maintenance: Immunizations -- COVID- Last completed 02/04/20 Proofreader- 2 doses) Tdap- Last completed 02/05/18 Influenza Vaccine- Last completed 04/02/19 PAP -- Not completed Bone Density -- N/A Diet -- Was on keto; recently stopped and decreased sugar intake Sleep habits -- Normal sleeping schedule Exercise -- Has been exercising regularly Current Weight -- Stable; recently went up due to discontinuing keto diet Weight History: Wt Readings from Last 10 Encounters:  04/27/21 250 lb 6.1 oz (113.6 kg)  04/15/21 244 lb 8 oz (110.9 kg)  10/06/20 230 lb (104.3 kg)  09/25/20 233 lb 3.2 oz (105.8 kg)  08/11/20 245 lb (111.1 kg)  06/23/20 257 lb 6.1 oz (116.7 kg)  08/16/19 261 lb 8 oz (118.6 kg)  04/02/19 240 lb 8 oz (109.1 kg) (>99 %, Z= 2.43)*  02/20/19 220 lb (99.8 kg) (99 %, Z= 2.21)*  07/15/18 220 lb (99.8 kg) (99 %, Z= 2.20)*   * Growth percentiles are based on CDC (Girls, 2-20 Years) data.   Body mass index is 41.67 kg/m. Mood -- Stable  No LMP recorded. Patient has had an implant. Period  characteristics -- No menstrual cycles due to birth control Birth control -- Nexplanon 68 mg implant with no adverse effects    reports current alcohol use of about 15.0 standard drinks per week.  Tobacco Use: Medium Risk   Smoking Tobacco Use: Former   Smokeless Tobacco Use: Never   Passive Exposure: Not on file     Depression screen [facility] 2/9 08/16/2019  Decreased Interest 2  Down, Depressed, Hopeless 1  PHQ - 2 Score 3  Altered sleeping 1  Tired, decreased energy 3  Change in appetite 1  Feeling bad or failure about yourself  3  Trouble concentrating 0  Moving slowly or fidgety/restless 0  Suicidal thoughts 1  PHQ-9 Score 12  Difficult doing work/chores Somewhat difficult  Some encounter information is confidential and restricted. Go to Review Flowsheets activity to see all data.     Other providers/specialists: Patient Care Team: Jarold Motto, [place] as PCP - General (Physician Assistant)   PMHx, SurgHx, SocialHx, Medications, and Allergies were reviewed in the Visit Navigator and updated as appropriate.   Past Medical History:  Diagnosis Date   Anxiety    Depression    History of chicken pox      Past Surgical History:  Procedure Laterality Date   TONSILLECTOMY N/A 03/02/2019    History reviewed. No pertinent family history.  Social History   Tobacco Use   Smoking status: Former    Types: E-cigarettes  Quit date: 10/13/2020    Years since quitting: 0.5   Smokeless tobacco: Never  Vaping Use   Vaping Use: Every day  Substance Use Topics   Alcohol use: Yes    Alcohol/week: 15.0 standard drinks    Types: 15 Shots of liquor per week    Comment: 3 x's a week   Drug use: Never    Review of Systems:   Review of Systems  Constitutional:  Negative for chills, fever, malaise/fatigue and weight loss.  HENT:  Negative for hearing loss, sinus pain and sore throat.   Respiratory:  Negative for cough and hemoptysis.   Cardiovascular:  Negative for chest  pain, palpitations, leg swelling and PND.  Gastrointestinal:  Negative for abdominal pain, constipation, diarrhea, heartburn, nausea and vomiting.  Genitourinary:  Negative for dysuria, frequency and urgency.  Musculoskeletal:  Negative for back pain, myalgias and neck pain.  Skin:  Negative for itching and rash.  Neurological:  Negative for dizziness, tingling, seizures and headaches.  Endo/Heme/Allergies:  Negative for polydipsia.  Psychiatric/Behavioral:  Negative for depression. The patient is not nervous/anxious.    Objective:   BP 120/70 (BP Location: Left Arm, Patient Position: Sitting, Cuff Size: Large)   Pulse 77   Temp 98.2 F (36.8 C) (Temporal)   Ht 5\' 5"  (1.651 m)   Wt 250 lb 6.1 oz (113.6 kg)   SpO2 98%   BMI 41.67 kg/m   General Appearance:    Alert, cooperative, no distress, appears stated age  Head:    Normocephalic, without obvious abnormality, atraumatic  Eyes:    PERRL, conjunctiva/corneas clear, EOM's intact, fundi    benign, both eyes  Ears:    Normal TM's and external ear canals, both ears  Nose:   Nares normal, septum midline, mucosa normal, no drainage    or sinus tenderness  Throat:   Lips, mucosa, and tongue normal; teeth and gums normal  Neck:   Supple, symmetrical, trachea midline, no adenopathy;    thyroid:  no enlargement/tenderness/nodules; no carotid   bruit or JVD  Back:     Symmetric, no curvature, ROM normal, no CVA tenderness  Lungs:     Clear to auscultation bilaterally, respirations unlabored  Chest Wall:    No tenderness or deformity   Heart:    Regular rate and rhythm, S1 and S2 normal, no murmur, rub   or gallop  Breast Exam:    Deferred  Abdomen:     Soft, non-tender, bowel sounds active all four quadrants,    no masses, no organomegaly  Genitalia:    Deferred -- patient obtained self swab  Rectal:    Deferred  Extremities:   Extremities normal, atraumatic, no cyanosis or edema  Pulses:   2+ and symmetric all extremities  Skin:    Skin color, texture, turgor normal, no rashes or lesions  Lymph nodes:   Cervical, supraclavicular, and axillary nodes normal  Neurologic:   CNII-XII intact, normal strength, sensation and reflexes    throughout    Assessment/Plan:   Encounter for general adult medical examination with abnormal findings Today patient counseled on age appropriate routine health concerns for screening and prevention, each reviewed and up to date or declined. Immunizations reviewed and up to date or declined. Labs ordered and reviewed. Risk factors for depression reviewed and negative. Hearing function and visual acuity are intact. ADLs screened and addressed as needed. Functional ability and level of safety reviewed and appropriate. Education, counseling and referrals performed based on assessed risks  today. Patient provided with a copy of personalized plan for preventive services.  Vaginal itching STD, BV and yeast testing performed today on self swab Recommend safe sex practices Will treat based on results  Screening for tuberculosis Update TB test and provide recommendations accordingly  Severe recurrent major depression without psychotic features (HCC) Denies any concerns at this time  Obesity, unspecified classification, unspecified obesity type, unspecified whether serious comorbidity present Continue to work on healthy lifestyle as able  Patient Counseling:   [x]     Nutrition: Stressed importance of moderation in sodium/caffeine intake, saturated fat and cholesterol, caloric balance, sufficient intake of fresh fruits, vegetables, fiber, calcium, iron, and 1 mg of folate supplement per day (for females capable of pregnancy).   [x]      Stressed the importance of regular exercise.    [x]     Substance Abuse: Discussed cessation/primary prevention of tobacco, alcohol, or other drug use; driving or other dangerous activities under the influence; availability of treatment for abuse.    [x]       Injury prevention: Discussed safety belts, safety helmets, smoke detector, smoking near bedding or upholstery.    [x]      Sexuality: Discussed sexually transmitted diseases, partner selection, use of condoms, avoidance of unintended pregnancy  and contraceptive alternatives.    [x]     Dental health: Discussed importance of regular tooth brushing, flossing, and dental visits.   [x]      Health maintenance and immunizations reviewed. Please refer to Health maintenance section.   I,Havlyn C Ratchford,acting as a for , PA.,have documented all relevant documentation on the behalf of , PA,as directed by  , PA while in the presence of , .   I, , Neurosurgeon, have reviewed all documentation for this visit. The documentation on 04/27/21 for the exam, diagnosis, procedures, and orders are all accurate and complete.   Jarold Motto, PA-C [facility]

## 2021-04-28 ENCOUNTER — Other Ambulatory Visit: Payer: Self-pay | Admitting: Physician Assistant

## 2021-04-28 LAB — CERVICOVAGINAL ANCILLARY ONLY
Bacterial Vaginitis (gardnerella): POSITIVE — AB
Candida Glabrata: NEGATIVE
Candida Vaginitis: NEGATIVE
Chlamydia: NEGATIVE
Comment: NEGATIVE
Comment: NEGATIVE
Comment: NEGATIVE
Comment: NEGATIVE
Comment: NEGATIVE
Comment: NORMAL
Neisseria Gonorrhea: NEGATIVE
Trichomonas: NEGATIVE

## 2021-04-28 MED ORDER — METRONIDAZOLE 0.75 % VA GEL: 1.0000 | Freq: Every day | VAGINAL | 0 refills | Status: AC

## 2021-04-29 LAB — QUANTIFERON-TB GOLD PLUS
Mitogen-NIL: >10 IU/mL
NIL: 0.03 IU/mL
QuantiFERON-TB Gold Plus: NEGATIVE
TB1-NIL: 0.01 IU/mL
TB2-NIL: 0.01 IU/mL

## 2021-06-29 ENCOUNTER — Other Ambulatory Visit: Payer: Self-pay

## 2021-06-29 ENCOUNTER — Ambulatory Visit
Admission: EM | Admit: 2021-06-29 | Discharge: 2021-06-29 | Disposition: A | Discharge status: Other Acute Inpatient Hospital | Payer: BC Managed Care – PPO

## 2021-06-30 ENCOUNTER — Encounter: Payer: Self-pay | Admitting: Family Medicine

## 2021-06-30 ENCOUNTER — Ambulatory Visit: Payer: BC Managed Care – PPO | Admitting: Family Medicine

## 2021-06-30 VITALS — BP 122/70 | HR 76 | Temp 97.8°F | Ht 65.0 in | Wt 257.4 lb

## 2021-06-30 DIAGNOSIS — N898 Other specified noninflammatory disorders of vagina: Secondary | ICD-10-CM

## 2021-06-30 NOTE — Progress Notes (Signed)
° °  Subjective:     Patient ID: Mary Carlson, female    DOB: 2001-03-27, 20 y.o.   MRN: [id number]  Chief Complaint  Patient presents with   STD screening   Vaginal Discharge    Symptoms started a few days ago Discharge has an odor     HPI-lab person went home sick so can't do labs.  Offered pt empiric tx, or going UC or return tomorrow  pt on "tight sch" and can't do anything till Friday.  She decided to wait on tx and will see her at 1030 Friday.  Tammy will refund the co-pay and start over on Friday Vag d/c and odor few days. No itching/burning.  Has Nexplanon so ne menses.   Had intercourse w/another partner.  No condoms.  Last intercourse 12/24 and symptoms started few days later.  Not sure if partner had any symptoms.  No f/c. No abd pain.   Had pap at [facility] this summer.   Health Maintenance Due  Topic Date Due   Pneumococcal Vaccine 43-69 Years old (1 - PPSV23 if available, else PCV20) 06/14/2001   COVID-19 Vaccine (3 - Booster for Pfizer series) 03/31/2020   PAP-Cervical Cytology Screening  Never done   PAP SMEAR-Modifier  Never done    Past Medical History:  Diagnosis Date   Anxiety    Depression    History of chicken pox     Past Surgical History:  Procedure Laterality Date   TONSILLECTOMY N/A 03/02/2019    Outpatient Medications Prior to Visit  Medication Sig Dispense Refill   etonogestrel (NEXPLANON) 68 MG IMPL implant      Multiple Vitamins-Minerals (MULTI-VITAMIN GUMMIES PO) Take 2 each by mouth daily in the afternoon.     No facility-administered medications prior to visit.    Allergies  Allergen Reactions   Food Itching    Walnuts    Other Anaphylaxis and Other (See Comments)    Walnuts & other tree nuts   Flagyl [Metronidazole] Other (See Comments)    Dizziness    Watermelon [Citrullus Vulgaris] Other (See Comments)    Itchy throat   Banana Rash   ALP:FXTKWIOX/[id number] except as noted in HPI      Objective:     BP  122/70    Pulse 76    Temp 97.8 F (36.6 C) (Temporal)    Ht 5\' 5"  (1.651 m)    Wt 257 lb 6 oz (116.7 kg)    SpO2 99%    BMI 42.83 kg/m  Wt Readings from Last 3 Encounters:  06/30/21 257 lb 6 oz (116.7 kg)  04/27/21 250 lb 6.1 oz (113.6 kg)  04/15/21 244 lb 8 oz (110.9 kg)      Gen: WDWN NAD NEURO: A&O x3.  CN II-XII intact.  PSYCH: normal mood. Good eye contact  Assessment & Plan:   Problem List Items Addressed This Visit   None  Will return 12/30 No orders of the defined types were placed in this encounter.   1/31., MD

## 2021-07-02 ENCOUNTER — Ambulatory Visit: Payer: BC Managed Care – PPO | Admitting: Family Medicine

## 2021-07-02 NOTE — Progress Notes (Signed)
Mary Carlson is a 20 y.o. female here for STD testing.  History of Present Illness:   Chief Complaint  Patient presents with   Exposure to STD    HPI  STD Testing Mary Carlson would like to have routine STD testing following an unprotected sexual encounter. She is having vaginal discharge and having vaginal odor. Denies itching, burning.  Has Nexplanon so no menses.   Last intercourse 12/24 and symptoms started few days later.  Not sure if partner had any symptoms.  No fever, chills, abdominal pain.  Dysuria Has burning with urination. Drinking plenty of water.  Denies nausea, vomiting, malaise, unusual back pain.  Anxiety and Depression Has recent increase in anxiety and depression. Denies SI/HI. Had a falling out with a close friend of hers. Doesn't feel like she needs medication but just noticing that she can have issues with reacting to things too abruptly.  Past Medical History:  Diagnosis Date   Anxiety    Depression    History of chicken pox      Social History   Tobacco Use   Smoking status: Former    Types: E-cigarettes    Quit date: 10/13/2020    Years since quitting: 0.7   Smokeless tobacco: Never  Vaping Use   Vaping Use: Every day  Substance Use Topics   Alcohol use: Yes    Alcohol/week: 15.0 standard drinks    Types: 15 Shots of liquor per week    Comment: 3 x's a week   Drug use: Never    Past Surgical History:  Procedure Laterality Date   TONSILLECTOMY N/A 03/02/2019    Family History  Problem Relation Age of Onset   Cancer Maternal Grandmother    Hypertension Paternal Grandmother    Hyperlipidemia Paternal Grandmother    Diabetes Paternal Grandmother    Hypertension Paternal Grandfather    Hyperlipidemia Paternal Grandfather     Allergies  Allergen Reactions   Food Itching    Walnuts    Other Anaphylaxis and Other (See Comments)    Walnuts & other tree nuts   Flagyl [Metronidazole] Other (See Comments)    Dizziness    Watermelon  [Citrullus Vulgaris] Other (See Comments)    Itchy throat   Banana Rash    Current Medications:   Current Outpatient Medications:    etonogestrel (NEXPLANON) 68 MG IMPL implant, , Disp: , Rfl:    Multiple Vitamins-Minerals (MULTI-VITAMIN GUMMIES PO), Take 2 each by mouth daily in the afternoon., Disp: , Rfl:    Review of Systems:   ROS Negative unless otherwise specified per HPI. Vitals:   Vitals:   07/06/21 1533  BP: 120/84  Pulse: 80  Temp: 97.9 F (36.6 C)  TempSrc: Temporal  SpO2: 98%  Weight: 256 lb 9.6 oz (116.4 kg)  Height: 5\' 5"  (1.651 m)     Body mass index is 42.7 kg/m.  Physical Exam:   Physical Exam Vitals and nursing note reviewed.  Constitutional:      General: She is not in acute distress.    Appearance: She is well-developed. She is not ill-appearing or toxic-appearing.  Cardiovascular:     Rate and Rhythm: Normal rate and regular rhythm.     Pulses: Normal pulses.     Heart sounds: Normal heart sounds, S1 normal and S2 normal.  Pulmonary:     Effort: Pulmonary effort is normal.     Breath sounds: Normal breath sounds.  Skin:    General: Skin is warm and dry.  Neurological:  Mental Status: She is alert.     GCS: GCS eye subscore is 4. GCS verbal subscore is 5. GCS motor subscore is 6.  Psychiatric:        Speech: Speech normal.        Behavior: Behavior normal. Behavior is cooperative.   Results for orders placed or performed in visit on 07/06/21  POCT Urinalysis Dipstick (Automated)  Result Value Ref Range   Color, UA yellow    Clarity, UA clear    Glucose, UA Negative Negative   Bilirubin, UA neg    Ketones, UA neg    Spec Grav, UA 1.015 1.010 - 1.025   Blood, UA positive    pH, UA 5.5 5.0 - 8.0   Protein, UA Negative Negative   Urobilinogen, UA 0.2 0.2 or 1.0 E.U./dL   Nitrite, UA neg    Leukocytes, UA Negative Negative     Assessment and Plan:   Screening examination for STD (sexually transmitted disease) Patient  obtained self swab Recommend abstinence until results have returned and to practice safe sex in future Will treat based on results She was unable to have blood drawn and was recommended to return when able  Dysuria No red flags Blood on UA but otherwise unremarkable Negative for leuks/nitrites Will await culture and provide recommendations accordingly  Anxiety and depression No red flags Denies need for any intervention at this time I discussed with patient that if they develop any SI, to tell someone immediately and seek medical attention. Continue to monitor  I,Mary Carlson,acting as a scribe for [employer], PA.,have documented all relevant documentation on the behalf of Jarold Motto, PA,as directed by  Jarold Motto, PA while in the presence of Jarold Motto, [place].  I, Jarold Motto, [place], have reviewed all documentation for this visit. The documentation on 07/06/21 for the exam, diagnosis, procedures, and orders are all accurate and complete.   Jarold Motto, PA-C

## 2021-07-06 ENCOUNTER — Other Ambulatory Visit (HOSPITAL_COMMUNITY)
Admission: RE | Admit: 2021-07-06 | Discharge: 2021-07-06 | Disposition: A | Discharge status: Home / Self Care | Payer: BC Managed Care – PPO | Source: Ambulatory Visit | Attending: Family Medicine | Admitting: Family Medicine

## 2021-07-06 ENCOUNTER — Ambulatory Visit (INDEPENDENT_AMBULATORY_CARE_PROVIDER_SITE_OTHER): Payer: BC Managed Care – PPO | Admitting: Physician Assistant

## 2021-07-06 ENCOUNTER — Encounter: Payer: Self-pay | Admitting: Physician Assistant

## 2021-07-06 ENCOUNTER — Other Ambulatory Visit: Payer: Self-pay

## 2021-07-06 VITALS — BP 120/84 | HR 80 | Temp 97.9°F | Ht 65.0 in | Wt 256.6 lb

## 2021-07-06 DIAGNOSIS — F32A Depression, unspecified: Secondary | ICD-10-CM

## 2021-07-06 DIAGNOSIS — Z113 Encounter for screening for infections with a predominantly sexual mode of transmission: Secondary | ICD-10-CM | POA: Insufficient documentation

## 2021-07-06 DIAGNOSIS — R300 Dysuria: Secondary | ICD-10-CM | POA: Diagnosis not present

## 2021-07-06 DIAGNOSIS — F419 Anxiety disorder, unspecified: Secondary | ICD-10-CM

## 2021-07-06 LAB — POC URINALSYSI DIPSTICK (AUTOMATED)
Bilirubin, UA: NEGATIVE
Blood, UA: POSITIVE
Glucose, UA: NEGATIVE
Ketones, UA: NEGATIVE
Leukocytes, UA: NEGATIVE
Nitrite, UA: NEGATIVE
Protein, UA: NEGATIVE
Spec Grav, UA: 1.015 (ref 1.010–1.025)
Urobilinogen, UA: 0.2 E.U./dL
pH, UA: 5.5 (ref 5.0–8.0)

## 2021-07-06 NOTE — Patient Instructions (Signed)
It was great to see you!  I'll be in touch with your results.  Take care,  Jarold Motto PA-C

## 2021-07-07 LAB — URINE CULTURE
MICRO NUMBER:: 12820803
SPECIMEN QUALITY:: ADEQUATE

## 2021-07-08 LAB — CERVICOVAGINAL ANCILLARY ONLY
Bacterial Vaginitis (gardnerella): POSITIVE — AB
Candida Glabrata: NEGATIVE
Candida Vaginitis: NEGATIVE
Chlamydia: NEGATIVE
Comment: NEGATIVE
Comment: NEGATIVE
Comment: NEGATIVE
Comment: NEGATIVE
Comment: NEGATIVE
Comment: NORMAL
Neisseria Gonorrhea: NEGATIVE
Trichomonas: NEGATIVE

## 2021-07-09 ENCOUNTER — Other Ambulatory Visit: Payer: Self-pay | Admitting: Physician Assistant

## 2021-07-09 MED ORDER — METRONIDAZOLE 0.75 % VA GEL: 1.0000 | Freq: Two times a day (BID) | VAGINAL | 0 refills | Status: DC

## 2021-08-13 ENCOUNTER — Other Ambulatory Visit: Payer: Self-pay

## 2021-08-13 ENCOUNTER — Telehealth: Payer: Self-pay | Admitting: Physician Assistant

## 2021-08-13 ENCOUNTER — Ambulatory Visit: Payer: BC Managed Care – PPO | Admitting: Family Medicine

## 2021-08-13 ENCOUNTER — Encounter: Payer: Self-pay | Admitting: Family Medicine

## 2021-08-13 ENCOUNTER — Other Ambulatory Visit (HOSPITAL_COMMUNITY)
Admission: RE | Admit: 2021-08-13 | Discharge: 2021-08-13 | Disposition: A | Discharge status: Home / Self Care | Payer: BC Managed Care – PPO | Source: Ambulatory Visit | Attending: Family Medicine | Admitting: Family Medicine

## 2021-08-13 VITALS — BP 110/70 | HR 85 | Temp 97.3°F | Ht 65.0 in | Wt 258.5 lb

## 2021-08-13 DIAGNOSIS — J020 Streptococcal pharyngitis: Secondary | ICD-10-CM | POA: Diagnosis not present

## 2021-08-13 DIAGNOSIS — Z202 Contact with and (suspected) exposure to infections with a predominantly sexual mode of transmission: Secondary | ICD-10-CM | POA: Insufficient documentation

## 2021-08-13 DIAGNOSIS — J029 Acute pharyngitis, unspecified: Secondary | ICD-10-CM | POA: Diagnosis not present

## 2021-08-13 LAB — POCT RAPID STREP A (OFFICE): Rapid Strep A Screen: POSITIVE — AB

## 2021-08-13 MED ORDER — AMOXICILLIN 500 MG PO CAPS: 500.0000 mg | ORAL_CAPSULE | Freq: Two times a day (BID) | ORAL | 0 refills | Status: AC

## 2021-08-13 NOTE — Telephone Encounter (Signed)
Pt was seen in office today @10 :30am with Dr Cherlynn Kaiser.  Patient Name: Mary Carlson Gender: Female DOB: Dec 10, 2000 Age: 21 Y 2 M 2 D Return Phone Number: [phone] (Primary) Address: City/ State/ Zip: [place]  [address] Client [place] [facility] [place] Client Site [place] [facility] [place] Night Provider Inda Coke- [place] Contact Type Call Who Is Calling Patient / Member / Family / Caregiver Call Type Triage / Clinical Relationship To Patient Self Return Phone Number [phone] (Primary) Chief Complaint Vaginal Discharge Reason for Call Symptomatic / Request for [place] wants to know if she can be tested for STDs. She is having vaginal discharge and itching. Translation No Nurse Assessment Nurse: Alvis Lemmings, RN, Marcie Bal Date/Time Eilene Ghazi Time): 08/13/2021 8:29:10 AM Confirm and document reason for call. If symptomatic, describe symptoms. ---Caller wants to know if she can be tested for STDs. She is having vaginal discharge and itching. Started 5 days ago. Odor Does the patient have any new or worsening symptoms? ---Yes Will a triage be completed? ---Yes Related visit to physician within the last 2 weeks? ---No Does the PT have any chronic conditions? (i.e. diabetes, asthma, this includes High risk factors for pregnancy, etc.) ---No Is the patient pregnant or possibly pregnant? (Ask all females between the ages of 28-55) ---No Is this a behavioral health or substance abuse call? ---No Guidelines Guideline Title Affirmed Question Affirmed Notes Nurse Date/Time Eilene Ghazi Time) Vaginal Discharge Bad smelling vaginal discharge [name] 08/13/2021 8:29:56 AM Disp. Time Eilene Ghazi Time) Disposition Final User 08/13/2021 8:31:51 AM SEE PCP WITHIN 3 DAYS Yes Alvis Lemmings, RN, Lenon Oms Disagree/Comply Comply Caller Understands Yes PreDisposition Call Doctor Care Advice Given Per  Guideline SEE PCP WITHIN 3 DAYS: * You need to be seen within 2 or 3 days. GENITAL HYGIENE: * Keep your genital area clean. Use mild soaps, but do not use them on your vulva. Wash the vulva area only with water. Gently pat the vulva area dry after washing. * Keep your genital area dry. Wear cotton underwear or underwear with a cotton crotch. CARE ADVICE given per Vaginal Discharge (Adult) guideline. Comments User: Manning Charity, RN Date/Time Eilene Ghazi Time): 08/13/2021 8:32:19 AM caller discontinued care advice, "in a hurry" Referrals REFERRED TO PCP OFFICE

## 2021-08-13 NOTE — Patient Instructions (Addendum)
You have strep throat-sending in antibiotics.  No work today  Use condoms

## 2021-08-13 NOTE — Telephone Encounter (Signed)
Noted, pt was seen. 

## 2021-08-13 NOTE — Progress Notes (Signed)
Subjective:     Patient ID: Mary Carlson, female    DOB: 12-03-2000, 21 y.o.   MRN: [id number]  Chief Complaint  Patient presents with   Sore Throat    Pt c/o sore throat x 4 days. No fever or chills.   Vaginal Discharge    Pt c/o white vaginal discharge with itching x 7 days. She has done Boric acid supplements.    HPI Here for STD testing and vaginal issues- white d/c like cottage cheese. Slight odor. Itching. No abd pain.  Also, c/o sore throat for 4 days.  No f/c/cough/ha/congestion.  New partner-unprotected intercourse 2 wks ago and oral sex more recently  Pap at [facility] this summer   Health Maintenance Due  Topic Date Due   COVID-19 Vaccine (3 - Booster for Pfizer series) 03/31/2020   PAP-Cervical Cytology Screening  Never done   PAP SMEAR-Modifier  Never done    Past Medical History:  Diagnosis Date   Anxiety    Depression    History of chicken pox     Past Surgical History:  Procedure Laterality Date   TONSILLECTOMY N/A 03/02/2019    Outpatient Medications Prior to Visit  Medication Sig Dispense Refill   etonogestrel (NEXPLANON) 68 MG IMPL implant      Multiple Vitamins-Minerals (MULTI-VITAMIN GUMMIES PO) Take 2 each by mouth daily in the afternoon.     metroNIDAZOLE (METROGEL VAGINAL) 0.75 % vaginal gel Place 1 Applicatorful vaginally 2 (two) times daily. (Patient not taking: Reported on 08/13/2021) 70 g 0   No facility-administered medications prior to visit.    Allergies  Allergen Reactions   Food Itching    Walnuts    Other Anaphylaxis and Other (See Comments)    Walnuts & other tree nuts   Flagyl [Metronidazole] Other (See Comments)    Dizziness    Watermelon [Citrullus Vulgaris] Other (See Comments)    Itchy throat   Banana Rash   TRR:NHAFBXUX/[id number] except as noted in HPI      Objective:     BP 110/70 (BP Location: Right Arm, Patient Position: Sitting, Cuff Size: Large)    Pulse 85    Temp (!) 97.3 F (36.3 C)  (Temporal)    Ht 5\' 5"  (1.651 m)    Wt 258 lb 8 oz (117.3 kg)    SpO2 94%    BMI 43.02 kg/m  Wt Readings from Last 3 Encounters:  08/13/21 258 lb 8 oz (117.3 kg)  07/06/21 256 lb 9.6 oz (116.4 kg)  06/30/21 257 lb 6 oz (116.7 kg)        Gen: WDWN NAD MOF HEENT: NCAT, conjunctiva not injected, sclera nonicteric TM WNL B, OP moist, no exudates  NECK:  supple, no thyromegaly, + tender R submand nodes, no carotid bruits CARDIAC: RRR, S1S2+, no murmur. DP 2+B LUNGS: CTAB. No wheezes ABDOMEN:  BS+, soft, NTND, No HSM, no masses EXT:  no edema MSK: no gross abnormalities.  NEURO: A&O x3.  CN II-XII intact.  PSYCH: normal mood. Good eye contact  Results for orders placed or performed in visit on 08/13/21  POCT rapid strep A  Result Value Ref Range   Rapid Strep A Screen Positive (A) Negative     Assessment & Plan:   Problem List Items Addressed This Visit   None Visit Diagnoses     STD exposure    -  Primary   Relevant Orders   Cervicovaginal ancillary only   RPR   HIV Antibody (routine  testing w rflx)   GC/CT Probe, Amp (Throat)   Pharyngitis, unspecified etiology       Relevant Orders   GC/CT Probe, Amp (Throat)   Strep pharyngitis       Relevant Orders   POCT rapid strep A (Completed)      STD exposure and vag d/c-check labs/vag panel.  Use condoms Strep throat-amox.  Also check for gc/chlamydia.  Meds ordered this encounter  Medications   amoxicillin (AMOXIL) 500 MG capsule    Sig: Take 1 capsule (500 mg total) by mouth 2 (two) times daily for 10 days.    Dispense:  20 capsule    Refill:  0    Angelena Sole, MD

## 2021-08-16 LAB — CERVICOVAGINAL ANCILLARY ONLY
Bacterial Vaginitis (gardnerella): NEGATIVE
Candida Glabrata: NEGATIVE
Candida Vaginitis: NEGATIVE
Chlamydia: NEGATIVE
Comment: NEGATIVE
Comment: NEGATIVE
Comment: NEGATIVE
Comment: NEGATIVE
Comment: NEGATIVE
Comment: NORMAL
Neisseria Gonorrhea: NEGATIVE
Trichomonas: NEGATIVE

## 2021-08-16 LAB — GC/CHLAMYDIA PROBE, AMP (THROAT)

## 2021-08-16 LAB — TIQ-NTM

## 2021-08-16 LAB — HIV ANTIBODY (ROUTINE TESTING W REFLEX): HIV 1&2 Ab, 4th Generation: NONREACTIVE

## 2021-08-16 LAB — RPR: RPR Ser Ql: NONREACTIVE

## 2021-11-08 ENCOUNTER — Telehealth: Payer: Self-pay | Admitting: Physician Assistant

## 2021-11-08 NOTE — Telephone Encounter (Signed)
Patient called office wanting to sch an apt - patient proceeded stating that she was experiencing SOB- patient also stated that its been going on for about 4 months. Patient was advised to be triaged. Patient refused to be triaged. Patient stated she does not have covid. Patient was advised that her SOB is a reason of concern. Patient was ased to hold on the line. Patient hung up.  ?

## 2021-11-08 NOTE — Telephone Encounter (Signed)
Left detailed message on personal voicemail, calling about message of you having SOB has been going on for the past 4 months and you refused to be triaged. I would suggest you call back and schedule an appt with a provider to be evaluated. Lelon Mast is out of the office this week or go to an Urgent care to be evaluated. Please call back to schedule. ?

## 2021-11-09 NOTE — Telephone Encounter (Signed)
Pt scheduled with [facility] providers SH for 05/10 ?

## 2021-11-10 ENCOUNTER — Encounter: Payer: Self-pay | Admitting: Family

## 2021-11-10 ENCOUNTER — Other Ambulatory Visit (HOSPITAL_COMMUNITY)
Admission: RE | Admit: 2021-11-10 | Discharge: 2021-11-10 | Disposition: A | Discharge status: Home / Self Care | Payer: BC Managed Care – PPO | Source: Ambulatory Visit | Attending: Family | Admitting: Family

## 2021-11-10 ENCOUNTER — Ambulatory Visit: Payer: BC Managed Care – PPO | Admitting: Family

## 2021-11-10 VITALS — BP 114/73 | HR 79 | Temp 98.3°F | Ht 63.0 in | Wt 256.0 lb

## 2021-11-10 DIAGNOSIS — J4521 Mild intermittent asthma with (acute) exacerbation: Secondary | ICD-10-CM | POA: Diagnosis not present

## 2021-11-10 DIAGNOSIS — R112 Nausea with vomiting, unspecified: Secondary | ICD-10-CM

## 2021-11-10 DIAGNOSIS — L989 Disorder of the skin and subcutaneous tissue, unspecified: Secondary | ICD-10-CM | POA: Diagnosis not present

## 2021-11-10 DIAGNOSIS — Z113 Encounter for screening for infections with a predominantly sexual mode of transmission: Secondary | ICD-10-CM | POA: Insufficient documentation

## 2021-11-10 MED ORDER — ALBUTEROL SULFATE HFA 108 (90 BASE) MCG/ACT IN AERS: 2.0000 | INHALATION_SPRAY | Freq: Four times a day (QID) | RESPIRATORY_TRACT | 0 refills | Status: AC | PRN

## 2021-11-10 NOTE — Patient Instructions (Addendum)
It was very nice to see you today! ? ?Go to the lab and leave a urine specimen for testing. ?I have sent an Albuterol inhaler to use for your shortness of breath. Use twice a day for the next 5 days and prior to exercising to see if this helps. ?It can cause a jittery feeling, or increase your heart rate, but this resolves after 10-47minutes. ?Drink at least 2 liters of water daily. ? ?For the burn on your arm, use the Bactroban ointment I gave you once a day for the next few days, cover with a bandaid, and then leave uncovered if starting to scab over. ? ? ? ?PLEASE NOTE: ? ?If you had any lab tests please let us know if you have not heard back within a few days. You may see your results on MyChart before we have a chance to review them but we will give you a call once they are reviewed by [place]. If we ordered any referrals today, please let us know if you have not heard from their office within the next week.  ? ?Please try these tips to maintain a healthy lifestyle: ? ?Eat most of your calories during the day when you are active. Eliminate processed foods including packaged sweets (pies, cakes, cookies), reduce intake of potatoes, white bread, white pasta, and white rice. Look for whole grain options, oat flour or almond flour. ? ?Each meal should contain half fruits/vegetables, one quarter protein, and one quarter carbs (no bigger than a computer mouse). ? ?Cut down on sweet beverages. This includes juice, soda, and sweet tea. Also watch fruit intake, though this is a healthier sweet option, it still contains natural sugar! Limit to 3 servings daily. ? ?Drink at least 1 glass of water with each meal and aim for at least 8 glasses per day ? ?Exercise at least 150 minutes every week.  ? ?

## 2021-11-10 NOTE — Progress Notes (Signed)
? ?Subjective:  ? ? ? Patient ID: Mary Carlson, female    DOB: 2001-06-22, 21 y.o.   MRN: [id number] ? ?Chief Complaint  ?Patient presents with  ? Shortness of Breath  ?  Pt c/o for shortness of breath for a couple of months.   ? SEXUALLY TRANSMITTED DISEASE  ?  Pt states no symptoms just testing.   ? Burn  ?  Pt states she has a burn on her right for arm.  And has tried creams but did not help.   ? ?HPI: ?Shortness of breath:  reports having for a couple of months. Reports hx of Asthma as a child. Reports having trouble breathing during exercise, and sometimes at rest.  Denies any cough or waking up during night with cough, no wheezing, no allergy symptoms. ?STD testing:  pt reports high risk sexual behavior with mutiple partners and wants to be tested. Denies any symptoms.  ?Skin burn:  right forearm, hot oil, pt has applied vaseline and covered with bandaid, but concerned it is infected. ? ?Assessment & Plan:  ? ?Problem List Items Addressed This Visit   ?None ?Visit Diagnoses   ? ? Mild intermittent asthma with acute exacerbation    -  Primary  ? reports new SOB for last 2 mos, denies any anxiety sx, no allergy sx, denies cough or nighttime awakenings with cough. Reports having Asthma as a child. Sending trial of Albuterol inhaler, advised on use & SE, drink plenty of water, use of saline nasal spray tid. ? ?Relevant Medications  ? albuterol (VENTOLIN HFA) 108 (90 Base) MCG/ACT inhaler  ? Screening examination for STD (sexually transmitted disease)      ? Relevant Orders  ? Urine cytology ancillary only  ? Skin lesion  - ?Advised pt to clean with soap and water, apply thin layer of Bactroban ointment (sample provided) to bandaid and cover wound qd for next 3 days until no longer draining. ?    ? Nausea and vomiting, unspecified vomiting type      ?      Relevant Medications  ? ondansetron (ZOFRAN) 4 MG tablet  ? ?  ? ? ?Outpatient Medications Prior to Visit  ?Medication Sig Dispense Refill  ? etonogestrel  (NEXPLANON) 68 MG IMPL implant     ? metroNIDAZOLE (METROGEL VAGINAL) 0.75 % vaginal gel Place 1 Applicatorful vaginally 2 (two) times daily. (Patient not taking: Reported on 08/13/2021) 70 g 0  ? Multiple Vitamins-Minerals (MULTI-VITAMIN GUMMIES PO) Take 2 each by mouth daily in the afternoon.    ? ?No facility-administered medications prior to visit.  ? ? ?Past Medical History:  ?Diagnosis Date  ? Anxiety   ? Depression   ? History of chicken pox   ? ? ?Past Surgical History:  ?Procedure Laterality Date  ? TONSILLECTOMY N/A 03/02/2019  ? ? ?Allergies  ?Allergen Reactions  ? Food Itching  ?  Walnuts ?  ? Other Anaphylaxis and Other (See Comments)  ?  Walnuts & other tree nuts  ? Flagyl [Metronidazole] Other (See Comments)  ?  Dizziness ?  ? Watermelon [Citrullus Vulgaris] Other (See Comments)  ?  Itchy throat  ? Banana Rash  ? ? ?   ?Objective:  ?  ?Physical Exam ?Vitals and nursing note reviewed.  ?Constitutional:   ?   Appearance: Normal appearance.  ?Cardiovascular:  ?   Rate and Rhythm: Normal rate and regular rhythm.  ?Pulmonary:  ?   Effort: Pulmonary effort is normal.  ?  Breath sounds: Normal breath sounds.  ?Musculoskeletal:     ?   General: Normal range of motion.  ?Skin: ?   General: Skin is warm and dry.  ?   Findings: Lesion (erythema, shallow, approx. 3cmx0.5) present.  ?Neurological:  ?   Mental Status: She is alert.  ?Psychiatric:     ?   Mood and Affect: Mood normal.     ?   Behavior: Behavior normal.  ? ? ?BP 114/73 (BP Location: Left Arm, Patient Position: Sitting, Cuff Size: Large)   Pulse 79   Temp 98.3 ?F (36.8 ?C) (Temporal)   Ht 5\' 3"  (1.6 m)   Wt 256 lb (116.1 kg)   SpO2 98%   BMI 45.35 kg/m?  ?Wt Readings from Last 3 Encounters:  ?11/10/21 256 lb (116.1 kg)  ?08/13/21 258 lb 8 oz (117.3 kg)  ?07/06/21 256 lb 9.6 oz (116.4 kg)  ? ? ?   ? ?Meds ordered this encounter  ?Medications  ? albuterol (VENTOLIN HFA) 108 (90 Base) MCG/ACT inhaler  ?  Sig: Inhale 2 puffs into the lungs every 6  (six) hours as needed for wheezing or shortness of breath.  ?  Dispense:  8 g  ?  Refill:  0  ?  Order Specific Question:   Supervising Provider  ?  Answer:   ANDY, CAMILLE L [[id number]]  ? ondansetron (ZOFRAN) 4 MG tablet  ?  Sig: Take 1 tablet (4 mg total) by mouth every 8 (eight) hours as needed for nausea or vomiting.  ?  Dispense:  18 tablet  ?  Refill:  1  ?  Order Specific Question:   Supervising Provider  ?  Answer:   ANDY, CAMILLE L [[id number]]  ? ? ?Jeanie Sewer, NP ? ?

## 2021-11-11 ENCOUNTER — Telehealth: Payer: Self-pay | Admitting: Physician Assistant

## 2021-11-11 MED ORDER — ONDANSETRON HCL 4 MG PO TABS: 4.0000 mg | ORAL_TABLET | Freq: Three times a day (TID) | ORAL | 1 refills | Status: DC | PRN

## 2021-11-11 MED ORDER — ONDANSETRON HCL 4 MG PO TABS: 4.0000 mg | ORAL_TABLET | Freq: Three times a day (TID) | ORAL | 0 refills | Status: DC | PRN

## 2021-11-11 NOTE — Telephone Encounter (Signed)
I called and spoke with patient, RX sent of Zofran sent and told to stop inhaler.  ?

## 2021-11-11 NOTE — Telephone Encounter (Signed)
Pt states she experienced vomiting, headache, upset stomach, and shaking uncontrollably last night after using Ventolin Inhaler. ?Pt states she has not eaten today, yet, for fear of vomiting. ?Pt states she administered the inhaler twice last night. ? ?Pt is unsure if she should continue to use the inhaler or should get a different medication.  ? ?Please call back. ?

## 2021-11-12 LAB — URINE CYTOLOGY ANCILLARY ONLY
Chlamydia: NEGATIVE
Comment: NEGATIVE
Comment: NEGATIVE
Comment: NORMAL
Neisseria Gonorrhea: NEGATIVE
Trichomonas: NEGATIVE

## 2021-11-12 NOTE — Progress Notes (Signed)
Hi Mary Carlson, ? ?Your urine STD testing came back negative. ? ?Take care!

## 2022-04-06 ENCOUNTER — Encounter: Payer: Self-pay | Admitting: Physician Assistant

## 2022-04-06 ENCOUNTER — Ambulatory Visit (INDEPENDENT_AMBULATORY_CARE_PROVIDER_SITE_OTHER): Payer: BC Managed Care – PPO | Admitting: Physician Assistant

## 2022-04-06 ENCOUNTER — Other Ambulatory Visit (HOSPITAL_COMMUNITY)
Admission: RE | Admit: 2022-04-06 | Discharge: 2022-04-06 | Disposition: A | Discharge status: Home / Self Care | Payer: BC Managed Care – PPO | Source: Ambulatory Visit | Attending: Physician Assistant | Admitting: Physician Assistant

## 2022-04-06 VITALS — BP 110/70 | HR 90 | Temp 98.0°F | Ht 63.0 in | Wt 267.2 lb

## 2022-04-06 DIAGNOSIS — A749 Chlamydial infection, unspecified: Secondary | ICD-10-CM | POA: Diagnosis not present

## 2022-04-06 DIAGNOSIS — J029 Acute pharyngitis, unspecified: Secondary | ICD-10-CM

## 2022-04-06 DIAGNOSIS — Z113 Encounter for screening for infections with a predominantly sexual mode of transmission: Secondary | ICD-10-CM | POA: Diagnosis present

## 2022-04-06 DIAGNOSIS — B3731 Acute candidiasis of vulva and vagina: Secondary | ICD-10-CM | POA: Insufficient documentation

## 2022-04-06 LAB — POCT RAPID STREP A (OFFICE): Rapid Strep A Screen: NEGATIVE

## 2022-04-06 MED ORDER — AMOXICILLIN 875 MG PO TABS: 875.0000 mg | ORAL_TABLET | Freq: Two times a day (BID) | ORAL | 0 refills | Status: AC

## 2022-04-06 NOTE — Patient Instructions (Signed)
It was great to see you!  Call [facility] to get your IUD out  You may begin the antibiotic if no improvement in a few days, but if you develop fever, worsening cough or sinus pressure, begin the antibiotic and take as directed. Follow-up with PCP in 7-10 days if no improvement, sooner if needed  I will be in touch with your blood work and swab results.  Take care,  Inda Coke PA-C

## 2022-04-06 NOTE — Progress Notes (Signed)
Mary Carlson is a 21 y.o. female here for a new problem.  History of Present Illness:   Chief Complaint  Patient presents with   Sore Throat    Pt c/o sore throat x 2 days, nasal congestion. Denies fever or chills    Sore Throat     Sore throat Had strep a few months ago and had similar sx. PND currently Took Vitamin C, elderberry and zinc, ibuprofen Denies: fever, chills, diarrhea, vomiting   Vaginal odor Having vaginal odor Denies concerns for pregnancy, unusual vaginal discharge/bleeding, urinary sx Endorses recent unprotected sex  Has a nexplanon but would like this out due to concerns for weight gain    Past Medical History:  Diagnosis Date   Anxiety    Depression    History of chicken pox      Social History   Tobacco Use   Smoking status: Former    Types: E-cigarettes    Quit date: 10/13/2020    Years since quitting: 1.4   Smokeless tobacco: Never  Vaping Use   Vaping Use: Every day  Substance Use Topics   Alcohol use: Yes    Alcohol/week: 15.0 standard drinks of alcohol    Types: 15 Shots of liquor per week    Comment: 3 x's a week   Drug use: Never    Past Surgical History:  Procedure Laterality Date   TONSILLECTOMY N/A 03/02/2019    Family History  Problem Relation Age of Onset   Cancer Maternal Grandmother    Hypertension Paternal Grandmother    Hyperlipidemia Paternal Grandmother    Diabetes Paternal Grandmother    Hypertension Paternal Grandfather    Hyperlipidemia Paternal Grandfather     Allergies  Allergen Reactions   Food Itching    Walnuts    Other Anaphylaxis and Other (See Comments)    Walnuts & other tree nuts   Flagyl [Metronidazole] Other (See Comments)    Dizziness    Watermelon [Citrullus Vulgaris] Other (See Comments)    Itchy throat   Banana Rash    Current Medications:   Current Outpatient Medications:    albuterol (VENTOLIN HFA) 108 (90 Base) MCG/ACT inhaler, Inhale 2 puffs into the lungs every 6  (six) hours as needed for wheezing or shortness of breath., Disp: 8 g, Rfl: 0   etonogestrel (NEXPLANON) 68 MG IMPL implant, , Disp: , Rfl:    Multiple Vitamins-Minerals (MULTI-VITAMIN GUMMIES PO), Take 2 each by mouth daily in the afternoon., Disp: , Rfl:    ondansetron (ZOFRAN) 4 MG tablet, Take 1 tablet (4 mg total) by mouth every 8 (eight) hours as needed for nausea or vomiting., Disp: 18 tablet, Rfl: 1   sertraline (ZOLOFT) 100 MG tablet, Take 100 mg by mouth daily., Disp: , Rfl:    Review of Systems:   ROS Negative unless otherwise specified per HPI.  Vitals:   Vitals:   04/06/22 1059  BP: 110/70  Pulse: 90  Temp: 98 F (36.7 C)  TempSrc: Temporal  SpO2: 96%  Weight: 267 lb 4 oz (121.2 kg)  Height: 5\' 3"  (1.6 m)     Body mass index is 47.34 kg/m.  Physical Exam:   Physical Exam Vitals and nursing note reviewed.  Constitutional:      General: She is not in acute distress.    Appearance: She is well-developed. She is not ill-appearing or toxic-appearing.  HENT:     Right Ear: A middle ear effusion is present. Tympanic membrane is not scarred, perforated or  erythematous.     Left Ear: Tympanic membrane is not scarred, perforated or erythematous.  Cardiovascular:     Rate and Rhythm: Normal rate and regular rhythm.     Pulses: Normal pulses.     Heart sounds: Normal heart sounds, S1 normal and S2 normal.  Pulmonary:     Effort: Pulmonary effort is normal.     Breath sounds: Normal breath sounds.  Skin:    General: Skin is warm and dry.  Neurological:     Mental Status: She is alert.     GCS: GCS eye subscore is 4. GCS verbal subscore is 5. GCS motor subscore is 6.  Psychiatric:        Speech: Speech normal.        Behavior: Behavior normal. Behavior is cooperative.    Results for orders placed or performed in visit on 04/06/22  POCT rapid strep A  Result Value Ref Range   Rapid Strep A Screen Negative Negative     Assessment and Plan:   Sore  throat Strep test negative No red flags on discussion, patient is not in any obvious distress during our visit. Discussed progression of most viral illness, and recommended supportive care at this point in time. I did however provide pocket rx for oral amoxicillin should symptoms not improve as anticipated. Discussed over the counter supportive care options, with recommendations to push fluids and rest. Reviewed return precautions including new/worsening fever, SOB, new/worsening cough or other concerns.  Recommended need to self-quarantine and practice social distancing until symptoms resolve. Discussed current recommendations for COVID testing. I recommend that patient follow-up if symptoms worsen or persist despite treatment x 7-10 days, sooner if needed.  Screening examination for STD (sexually transmitted disease) She completed self swab RPR and HIV blood testing ordered Will treat based on results Encourage safe sex   Inda Coke, [place]

## 2022-04-07 ENCOUNTER — Other Ambulatory Visit: Payer: Self-pay | Admitting: Physician Assistant

## 2022-04-07 LAB — CERVICOVAGINAL ANCILLARY ONLY
Bacterial Vaginitis (gardnerella): NEGATIVE
Candida Glabrata: NEGATIVE
Candida Vaginitis: POSITIVE — AB
Chlamydia: POSITIVE — AB
Comment: NEGATIVE
Comment: NEGATIVE
Comment: NEGATIVE
Comment: NEGATIVE
Comment: NEGATIVE
Comment: NORMAL
Neisseria Gonorrhea: NEGATIVE
Trichomonas: NEGATIVE

## 2022-04-07 LAB — HIV ANTIBODY (ROUTINE TESTING W REFLEX): HIV 1&2 Ab, 4th Generation: NONREACTIVE

## 2022-04-07 LAB — RPR: RPR Ser Ql: NONREACTIVE

## 2022-04-07 MED ORDER — FLUCONAZOLE 150 MG PO TABS: 150.0000 mg | ORAL_TABLET | Freq: Once | ORAL | 0 refills | Status: AC

## 2022-04-07 MED ORDER — DOXYCYCLINE HYCLATE 100 MG PO TABS: 100.0000 mg | ORAL_TABLET | Freq: Two times a day (BID) | ORAL | 0 refills | Status: DC

## 2022-05-03 ENCOUNTER — Other Ambulatory Visit (HOSPITAL_COMMUNITY)
Admission: RE | Admit: 2022-05-03 | Discharge: 2022-05-03 | Disposition: A | Discharge status: Home / Self Care | Payer: BC Managed Care – PPO | Source: Ambulatory Visit | Attending: Nurse Practitioner | Admitting: Nurse Practitioner

## 2022-05-03 ENCOUNTER — Other Ambulatory Visit: Payer: Self-pay | Admitting: Nurse Practitioner

## 2022-05-03 DIAGNOSIS — Z124 Encounter for screening for malignant neoplasm of cervix: Secondary | ICD-10-CM | POA: Insufficient documentation

## 2022-05-05 LAB — CYTOLOGY - PAP: Diagnosis: NEGATIVE

## 2022-08-06 ENCOUNTER — Encounter (HOSPITAL_BASED_OUTPATIENT_CLINIC_OR_DEPARTMENT_OTHER): Payer: Self-pay | Admitting: Emergency Medicine

## 2022-08-06 ENCOUNTER — Encounter (HOSPITAL_COMMUNITY)
Admission: EM | Disposition: A | Discharge status: Home / Self Care | Payer: Self-pay | Source: Home / Self Care | Attending: Emergency Medicine

## 2022-08-06 ENCOUNTER — Emergency Department (HOSPITAL_COMMUNITY): Payer: BC Managed Care – PPO | Admitting: Certified Registered"

## 2022-08-06 ENCOUNTER — Other Ambulatory Visit: Payer: Self-pay

## 2022-08-06 ENCOUNTER — Inpatient Hospital Stay: Admit: 2022-08-06 | Payer: BC Managed Care – PPO | Admitting: Surgery

## 2022-08-06 ENCOUNTER — Ambulatory Visit (HOSPITAL_BASED_OUTPATIENT_CLINIC_OR_DEPARTMENT_OTHER)
Admission: EM | Admit: 2022-08-06 | Discharge: 2022-08-06 | Disposition: A | Discharge status: Home / Self Care | Payer: BC Managed Care – PPO | Attending: Emergency Medicine | Admitting: Emergency Medicine

## 2022-08-06 ENCOUNTER — Other Ambulatory Visit (HOSPITAL_BASED_OUTPATIENT_CLINIC_OR_DEPARTMENT_OTHER): Payer: BC Managed Care – PPO

## 2022-08-06 ENCOUNTER — Emergency Department (HOSPITAL_BASED_OUTPATIENT_CLINIC_OR_DEPARTMENT_OTHER): Payer: BC Managed Care – PPO

## 2022-08-06 DIAGNOSIS — K3580 Unspecified acute appendicitis: Secondary | ICD-10-CM | POA: Insufficient documentation

## 2022-08-06 DIAGNOSIS — Z1152 Encounter for screening for COVID-19: Secondary | ICD-10-CM | POA: Diagnosis not present

## 2022-08-06 DIAGNOSIS — Z87891 Personal history of nicotine dependence: Secondary | ICD-10-CM | POA: Insufficient documentation

## 2022-08-06 DIAGNOSIS — F32A Depression, unspecified: Secondary | ICD-10-CM | POA: Insufficient documentation

## 2022-08-06 DIAGNOSIS — F419 Anxiety disorder, unspecified: Secondary | ICD-10-CM | POA: Diagnosis not present

## 2022-08-06 DIAGNOSIS — R1011 Right upper quadrant pain: Secondary | ICD-10-CM | POA: Diagnosis present

## 2022-08-06 HISTORY — DX: Attention-deficit hyperactivity disorder, unspecified type: F90.9

## 2022-08-06 HISTORY — PX: LAPAROSCOPIC APPENDECTOMY: SHX408

## 2022-08-06 HISTORY — DX: COVID-19: U07.1

## 2022-08-06 LAB — CBC WITH DIFFERENTIAL/PLATELET
Abs Immature Granulocytes: 0.03 10*3/uL (ref 0.00–0.07)
Basophils Absolute: 0 10*3/uL (ref 0.0–0.1)
Basophils Relative: 0 %
Eosinophils Absolute: 0.1 10*3/uL (ref 0.0–0.5)
Eosinophils Relative: 1 %
HCT: 39.5 % (ref 36.0–46.0)
Hemoglobin: 13 g/dL (ref 12.0–15.0)
Immature Granulocytes: 0 %
Lymphocytes Relative: 16 %
Lymphs Abs: 2 10*3/uL (ref 0.7–4.0)
MCH: 27.8 pg (ref 26.0–34.0)
MCHC: 32.9 g/dL (ref 30.0–36.0)
MCV: 84.4 fL (ref 80.0–100.0)
Monocytes Absolute: 0.8 10*3/uL (ref 0.1–1.0)
Monocytes Relative: 7 %
Neutro Abs: 9.6 10*3/uL — ABNORMAL HIGH (ref 1.7–7.7)
Neutrophils Relative %: 76 %
Platelets: 248 10*3/uL (ref 150–400)
RBC: 4.68 MIL/uL (ref 3.87–5.11)
RDW: 13.5 % (ref 11.5–15.5)
WBC: 12.6 10*3/uL — ABNORMAL HIGH (ref 4.0–10.5)
nRBC: 0 % (ref 0.0–0.2)

## 2022-08-06 LAB — RAPID URINE DRUG SCREEN, HOSP PERFORMED
Amphetamines: NOT DETECTED
Barbiturates: NOT DETECTED
Benzodiazepines: NOT DETECTED
Cocaine: NOT DETECTED
Opiates: NOT DETECTED
Tetrahydrocannabinol: NOT DETECTED

## 2022-08-06 LAB — COMPREHENSIVE METABOLIC PANEL
ALT: 18 U/L (ref 0–44)
AST: 15 U/L (ref 15–41)
Albumin: 4.4 g/dL (ref 3.5–5.0)
Alkaline Phosphatase: 50 U/L (ref 38–126)
Anion gap: 10 (ref 5–15)
BUN: 11 mg/dL (ref 6–20)
CO2: 23 mmol/L (ref 22–32)
Calcium: 9.4 mg/dL (ref 8.9–10.3)
Chloride: 106 mmol/L (ref 98–111)
Creatinine, Ser: 0.81 mg/dL (ref 0.44–1.00)
GFR, Estimated: >60 mL/min (ref >60)
Glucose, Bld: 87 mg/dL (ref 70–99)
Potassium: 3.9 mmol/L (ref 3.5–5.1)
Sodium: 139 mmol/L (ref 135–145)
Total Bilirubin: 0.3 mg/dL (ref 0.3–1.2)
Total Protein: 7.7 g/dL (ref 6.5–8.1)

## 2022-08-06 LAB — URINALYSIS, ROUTINE W REFLEX MICROSCOPIC
Bilirubin Urine: NEGATIVE
Glucose, UA: NEGATIVE mg/dL
Hgb urine dipstick: NEGATIVE
Ketones, ur: NEGATIVE mg/dL
Nitrite: NEGATIVE
Protein, ur: NEGATIVE mg/dL
Specific Gravity, Urine: 1.007 (ref 1.005–1.030)
pH: 7 (ref 5.0–8.0)

## 2022-08-06 LAB — LIPASE, BLOOD: Lipase: 17 U/L (ref 11–51)

## 2022-08-06 LAB — PREGNANCY, URINE: Preg Test, Ur: NEGATIVE

## 2022-08-06 LAB — RESP PANEL BY RT-PCR (RSV, FLU A&B, COVID)  RVPGX2
Influenza A by PCR: NEGATIVE
Influenza B by PCR: NEGATIVE
Resp Syncytial Virus by PCR: NEGATIVE
SARS Coronavirus 2 by RT PCR: NEGATIVE

## 2022-08-06 SURGERY — APPENDECTOMY, LAPAROSCOPIC
Anesthesia: General | Site: Abdomen

## 2022-08-06 MED ORDER — MIDAZOLAM HCL 2 MG/2ML IJ SOLN
INTRAMUSCULAR | Status: DC | PRN
  Administered 2022-08-06: 2 mg via INTRAVENOUS

## 2022-08-06 MED ORDER — PHENYLEPHRINE 80 MCG/ML (10ML) SYRINGE FOR IV PUSH (FOR BLOOD PRESSURE SUPPORT)
PREFILLED_SYRINGE | INTRAVENOUS | Status: DC | PRN
  Administered 2022-08-06: 160 ug via INTRAVENOUS

## 2022-08-06 MED ORDER — TRAMADOL HCL 50 MG PO TABS: 50.0000 mg | ORAL_TABLET | Freq: Four times a day (QID) | ORAL | 0 refills | Status: AC | PRN

## 2022-08-06 MED ORDER — IOHEXOL 300 MG/ML  SOLN
100.0000 mL | Freq: Once | INTRAMUSCULAR | Status: AC | PRN
  Administered 2022-08-06: 100 mL via INTRAVENOUS

## 2022-08-06 MED ORDER — METRONIDAZOLE 500 MG/100ML IV SOLN
500.0000 mg | Freq: Once | INTRAVENOUS | Status: AC
  Administered 2022-08-06: 500 mg via INTRAVENOUS
  Filled 2022-08-06: qty 100

## 2022-08-06 MED ORDER — ONDANSETRON HCL 4 MG/2ML IJ SOLN
4.0000 mg | Freq: Once | INTRAMUSCULAR | Status: AC
  Administered 2022-08-06: 4 mg via INTRAVENOUS
  Filled 2022-08-06: qty 2

## 2022-08-06 MED ORDER — HYDROMORPHONE HCL 1 MG/ML IJ SOLN
INTRAMUSCULAR | Status: AC
  Filled 2022-08-06: qty 1

## 2022-08-06 MED ORDER — HYDROMORPHONE HCL 1 MG/ML IJ SOLN
1.0000 mg | Freq: Once | INTRAMUSCULAR | Status: AC
  Administered 2022-08-06: 1 mg via INTRAVENOUS
  Filled 2022-08-06: qty 1

## 2022-08-06 MED ORDER — ORAL CARE MOUTH RINSE: 15.0000 mL | Freq: Once | OROMUCOSAL | Status: AC

## 2022-08-06 MED ORDER — PROPOFOL 10 MG/ML IV BOLUS
INTRAVENOUS | Status: DC | PRN
  Administered 2022-08-06: 200 mg via INTRAVENOUS

## 2022-08-06 MED ORDER — SUCCINYLCHOLINE CHLORIDE 200 MG/10ML IV SOSY
PREFILLED_SYRINGE | INTRAVENOUS | Status: DC | PRN
  Administered 2022-08-06: 10 mg via INTRAVENOUS
  Administered 2022-08-06: 200 mg via INTRAVENOUS
  Administered 2022-08-06: 100 mg via INTRAVENOUS

## 2022-08-06 MED ORDER — ONDANSETRON HCL 4 MG/2ML IJ SOLN
INTRAMUSCULAR | Status: DC | PRN
  Administered 2022-08-06: 4 mg via INTRAVENOUS

## 2022-08-06 MED ORDER — DEXAMETHASONE SODIUM PHOSPHATE 10 MG/ML IJ SOLN
INTRAMUSCULAR | Status: DC | PRN
  Administered 2022-08-06: 10 mg via INTRAVENOUS

## 2022-08-06 MED ORDER — ROCURONIUM BROMIDE 10 MG/ML (PF) SYRINGE
PREFILLED_SYRINGE | INTRAVENOUS | Status: DC | PRN
  Administered 2022-08-06: 50 mg via INTRAVENOUS

## 2022-08-06 MED ORDER — 0.9 % SODIUM CHLORIDE (POUR BTL) OPTIME
TOPICAL | Status: DC | PRN
  Administered 2022-08-06: 1000 mL

## 2022-08-06 MED ORDER — SODIUM CHLORIDE 0.9 % IV SOLN
2.0000 g | Freq: Once | INTRAVENOUS | Status: AC
  Administered 2022-08-06: 2 g via INTRAVENOUS
  Filled 2022-08-06: qty 20

## 2022-08-06 MED ORDER — LIDOCAINE VISCOUS HCL 2 % MT SOLN
15.0000 mL | Freq: Once | OROMUCOSAL | Status: AC
  Administered 2022-08-06: 15 mL via ORAL
  Filled 2022-08-06: qty 15

## 2022-08-06 MED ORDER — SODIUM CHLORIDE 0.9 % IR SOLN
Status: DC | PRN
  Administered 2022-08-06: 1000 mL

## 2022-08-06 MED ORDER — SODIUM CHLORIDE 0.9 % IV BOLUS
1000.0000 mL | Freq: Once | INTRAVENOUS | Status: AC
  Administered 2022-08-06: 1000 mL via INTRAVENOUS

## 2022-08-06 MED ORDER — LIDOCAINE 2% (20 MG/ML) 5 ML SYRINGE
INTRAMUSCULAR | Status: DC | PRN
  Administered 2022-08-06: 100 mg via INTRAVENOUS

## 2022-08-06 MED ORDER — HYDROMORPHONE HCL 1 MG/ML IJ SOLN
0.2500 mg | INTRAMUSCULAR | Status: DC | PRN
  Administered 2022-08-06 (×4): 0.5 mg via INTRAVENOUS

## 2022-08-06 MED ORDER — FENTANYL CITRATE (PF) 250 MCG/5ML IJ SOLN
INTRAMUSCULAR | Status: DC | PRN
  Administered 2022-08-06: 150 ug via INTRAVENOUS

## 2022-08-06 MED ORDER — LACTATED RINGERS IV SOLN: INTRAVENOUS | Status: DC

## 2022-08-06 MED ORDER — BUPIVACAINE HCL 0.25 % IJ SOLN
INTRAMUSCULAR | Status: DC | PRN
  Administered 2022-08-06: 19 mL

## 2022-08-06 MED ORDER — PROPOFOL 10 MG/ML IV BOLUS
INTRAVENOUS | Status: AC
  Filled 2022-08-06: qty 20

## 2022-08-06 MED ORDER — PROMETHAZINE HCL 25 MG/ML IJ SOLN: 6.2500 mg | INTRAMUSCULAR | Status: DC | PRN

## 2022-08-06 MED ORDER — AMISULPRIDE (ANTIEMETIC) 5 MG/2ML IV SOLN: 10.0000 mg | Freq: Once | INTRAVENOUS | Status: DC | PRN

## 2022-08-06 MED ORDER — SODIUM CHLORIDE 0.9 % IV SOLN: INTRAVENOUS | Status: DC

## 2022-08-06 MED ORDER — KETOROLAC TROMETHAMINE 30 MG/ML IJ SOLN
30.0000 mg | Freq: Once | INTRAMUSCULAR | Status: AC
  Administered 2022-08-06: 30 mg via INTRAVENOUS
  Filled 2022-08-06: qty 1

## 2022-08-06 MED ORDER — FENTANYL CITRATE (PF) 250 MCG/5ML IJ SOLN
INTRAMUSCULAR | Status: AC
  Filled 2022-08-06: qty 5

## 2022-08-06 MED ORDER — MEPERIDINE HCL 25 MG/ML IJ SOLN: 6.2500 mg | INTRAMUSCULAR | Status: DC | PRN

## 2022-08-06 MED ORDER — FAMOTIDINE IN NACL 20-0.9 MG/50ML-% IV SOLN
20.0000 mg | Freq: Once | INTRAVENOUS | Status: AC
  Administered 2022-08-06: 20 mg via INTRAVENOUS
  Filled 2022-08-06: qty 50

## 2022-08-06 MED ORDER — BUPIVACAINE HCL (PF) 0.25 % IJ SOLN
INTRAMUSCULAR | Status: AC
  Filled 2022-08-06: qty 30

## 2022-08-06 MED ORDER — CHLORHEXIDINE GLUCONATE 0.12 % MT SOLN
15.0000 mL | Freq: Once | OROMUCOSAL | Status: AC
  Filled 2022-08-06: qty 15

## 2022-08-06 MED ORDER — MIDAZOLAM HCL 2 MG/2ML IJ SOLN
INTRAMUSCULAR | Status: AC
  Filled 2022-08-06: qty 2

## 2022-08-06 MED ORDER — CHLORHEXIDINE GLUCONATE 0.12 % MT SOLN
OROMUCOSAL | Status: AC
  Administered 2022-08-06: 15 mL via OROMUCOSAL
  Filled 2022-08-06: qty 15

## 2022-08-06 MED ORDER — ALUM & MAG HYDROXIDE-SIMETH 200-200-20 MG/5ML PO SUSP
30.0000 mL | Freq: Once | ORAL | Status: AC
  Administered 2022-08-06: 30 mL via ORAL
  Filled 2022-08-06: qty 30

## 2022-08-06 SURGICAL SUPPLY — 45 items
APPLIER CLIP 5 13 M/L LIGAMAX5 (MISCELLANEOUS)
BAG COUNTER SPONGE SURGICOUNT (BAG) ×1 IMPLANT
BLADE CLIPPER SURG (BLADE) IMPLANT
CANISTER SUCT 3000ML PPV (MISCELLANEOUS) ×1 IMPLANT
CHLORAPREP W/TINT 26 (MISCELLANEOUS) ×1 IMPLANT
CLIP APPLIE 5 13 M/L LIGAMAX5 (MISCELLANEOUS) IMPLANT
COVER SURGICAL LIGHT HANDLE (MISCELLANEOUS) ×1 IMPLANT
CUTTER FLEX LINEAR 45M (STAPLE) ×1 IMPLANT
DERMABOND ADVANCED .7 DNX12 (GAUZE/BANDAGES/DRESSINGS) ×1 IMPLANT
ELECT REM PT RETURN 9FT ADLT (ELECTROSURGICAL) ×1
ELECTRODE REM PT RTRN 9FT ADLT (ELECTROSURGICAL) ×1 IMPLANT
GLOVE BIO SURGEON STRL SZ7.5 (GLOVE) ×1 IMPLANT
GLOVE INDICATOR 8.0 STRL GRN (GLOVE) ×1 IMPLANT
GOWN STRL REUS W/ TWL LRG LVL3 (GOWN DISPOSABLE) ×2 IMPLANT
GOWN STRL REUS W/ TWL XL LVL3 (GOWN DISPOSABLE) ×1 IMPLANT
GOWN STRL REUS W/TWL LRG LVL3 (GOWN DISPOSABLE)
GOWN STRL REUS W/TWL XL LVL3 (GOWN DISPOSABLE) ×3
GRASPER SUT TROCAR 14GX15 (MISCELLANEOUS) IMPLANT
KIT BASIN OR (CUSTOM PROCEDURE TRAY) ×1 IMPLANT
KIT TURNOVER KIT B (KITS) ×1 IMPLANT
NDL INSUFFLATION 14GA 120MM (NEEDLE) IMPLANT
NEEDLE INSUFFLATION 14GA 120MM (NEEDLE) ×1 IMPLANT
NS IRRIG 1000ML POUR BTL (IV SOLUTION) ×1 IMPLANT
PAD ARMBOARD 7.5X6 YLW CONV (MISCELLANEOUS) ×2 IMPLANT
PENCIL SMOKE EVACUATOR (MISCELLANEOUS) ×1 IMPLANT
RELOAD 45 VASCULAR/THIN (ENDOMECHANICALS) IMPLANT
RELOAD STAPLE 45 2.5 WHT GRN (ENDOMECHANICALS) IMPLANT
RELOAD STAPLE 45 3.5 BLU ETS (ENDOMECHANICALS) IMPLANT
RELOAD STAPLE TA45 3.5 REG BLU (ENDOMECHANICALS) ×1 IMPLANT
SCISSORS LAP 5X35 DISP (ENDOMECHANICALS) IMPLANT
SET IRRIG TUBING LAPAROSCOPIC (IRRIGATION / IRRIGATOR) ×1 IMPLANT
SET TUBE SMOKE EVAC HIGH FLOW (TUBING) ×1 IMPLANT
SHEARS HARMONIC ACE PLUS 36CM (ENDOMECHANICALS) ×1 IMPLANT
SPECIMEN JAR SMALL (MISCELLANEOUS) ×1 IMPLANT
SUT MNCRL AB 4-0 PS2 18 (SUTURE) ×1 IMPLANT
SYS BAG RETRIEVAL 10MM (BASKET) ×1
SYSTEM BAG RETRIEVAL 10MM (BASKET) ×1 IMPLANT
TOWEL GREEN STERILE (TOWEL DISPOSABLE) ×1 IMPLANT
TOWEL GREEN STERILE FF (TOWEL DISPOSABLE) ×1 IMPLANT
TRAY FOLEY W/BAG SLVR 16FR (SET/KITS/TRAYS/PACK) ×1
TRAY FOLEY W/BAG SLVR 16FR ST (SET/KITS/TRAYS/PACK) ×1 IMPLANT
TRAY LAPAROSCOPIC MC (CUSTOM PROCEDURE TRAY) ×1 IMPLANT
TROCAR ADV FIXATION 5X100MM (TROCAR) ×2 IMPLANT
TROCAR BALLN 12MMX100 BLUNT (TROCAR) ×1 IMPLANT
WATER STERILE IRR 1000ML POUR (IV SOLUTION) ×1 IMPLANT

## 2022-08-06 NOTE — ED Provider Notes (Signed)
[place] EMERGENCY DEPARTMENT AT [facility] Provider Note  CSN: [id number] Arrival date & time: 08/06/22 9450  Chief Complaint(s) Abdominal Pain  HPI Mary Carlson is a 22 y.o. female with past medical history as below, significant for anxiety, depression, MDD who presents to the ED with complaint of abdominal pain.  Patient reports 2 to 3 days of epigastric, right upper quad abdominal pain.  She thought she was constipated took a laxative and had a bowel movement but this did not relieve her discomfort.  Nausea without vomiting.  No fevers or chills.  No suspicious p.o. intake.  Also having some dysuria and suprapubic discomfort that started around the same time.  Unsure of last period due to contraception.  No recent travel or sick contacts but no history of prior abdominal surgery.  Took Motrin x 1 yesterday which did not alleviate her symptoms.  Past Medical History Past Medical History:  Diagnosis Date   Anxiety    Depression    History of chicken pox    Patient Active Problem List   Diagnosis Date Noted   MDD (major depressive disorder), recurrent episode, severe (HCC) 12/27/2019   Severe recurrent major depression without psychotic features (HCC) 05/06/2019   Home Medication(s) Prior to Admission medications   Medication Sig Start Date End Date Taking? Authorizing Provider  albuterol (VENTOLIN HFA) 108 (90 Base) MCG/ACT inhaler Inhale 2 puffs into the lungs every 6 (six) hours as needed for wheezing or shortness of breath. 11/10/21   Dulce Sellar, NP  doxycycline (VIBRA-TABS) 100 MG tablet Take 1 tablet (100 mg total) by mouth 2 (two) times daily. 04/07/22   Jarold Motto, PA  etonogestrel (NEXPLANON) 68 MG IMPL implant     [provider]  Multiple Vitamins-Minerals (MULTI-VITAMIN GUMMIES PO) Take 2 each by mouth daily in the afternoon.    [provider]  ondansetron (ZOFRAN) 4 MG tablet Take 1 tablet (4 mg total) by mouth every 8 (eight)  hours as needed for nausea or vomiting. 11/11/21   Dulce Sellar, NP  sertraline (ZOLOFT) 100 MG tablet Take 100 mg by mouth daily. 03/28/22   [provider]                                                                                                                                    Past Surgical History Past Surgical History:  Procedure Laterality Date   TONSILLECTOMY N/A 03/02/2019   Family History Family History  Problem Relation Age of Onset   Cancer Maternal Grandmother    Hypertension Paternal Grandmother    Hyperlipidemia Paternal Grandmother    Diabetes Paternal Grandmother    Hypertension Paternal Grandfather    Hyperlipidemia Paternal Grandfather     Social History Social History   Tobacco Use   Smoking status: Former    Types: E-cigarettes    Quit date: 10/13/2020    Years since quitting: 1.8   Smokeless tobacco:  Never  Vaping Use   Vaping Use: Every day  Substance Use Topics   Alcohol use: Yes    Alcohol/week: 15.0 standard drinks of alcohol    Types: 15 Shots of liquor per week    Comment: 3 x's a week   Drug use: Never   Allergies Food, Other, Watermelon [citrullus vulgaris], and Banana  Review of Systems Review of Systems  Constitutional:  Negative for activity change and fever.  HENT:  Negative for facial swelling and trouble swallowing.   Eyes:  Negative for discharge and redness.  Respiratory:  Negative for cough and shortness of breath.   Cardiovascular:  Negative for chest pain and palpitations.  Gastrointestinal:  Positive for abdominal pain and nausea.  Genitourinary:  Negative for dysuria and flank pain.  Musculoskeletal:  Negative for back pain and gait problem.  Skin:  Negative for pallor and rash.  Neurological:  Negative for syncope and headaches.    Physical Exam Vital Signs  I have reviewed the triage vital signs BP 109/78   Pulse 76   Temp 98.6 F (37 C) (Oral)   Resp 16   Ht 5\' 5"  (1.651 m)   Wt 118.8 kg    SpO2 99%   BMI 43.60 kg/m  Physical Exam Vitals and nursing note reviewed.  Constitutional:      General: She is not in acute distress.    Appearance: Normal appearance. She is obese. She is not ill-appearing.  HENT:     Head: Normocephalic and atraumatic.     Right Ear: External ear normal.     Left Ear: External ear normal.     Nose: Nose normal.     Mouth/Throat:     Mouth: Mucous membranes are moist.  Eyes:     General: No scleral icterus.       Right eye: No discharge.        Left eye: No discharge.  Cardiovascular:     Rate and Rhythm: Normal rate and regular rhythm.     Pulses: Normal pulses.     Heart sounds: Normal heart sounds.  Pulmonary:     Effort: Pulmonary effort is normal. No respiratory distress.     Breath sounds: Normal breath sounds.  Abdominal:     General: Abdomen is flat.     Palpations: Abdomen is soft.     Tenderness: There is abdominal tenderness in the right upper quadrant, epigastric area and suprapubic area. There is no guarding or rebound.  Musculoskeletal:     Cervical back: No rigidity.     Right lower leg: No edema.     Left lower leg: No edema.  Skin:    General: Skin is warm and dry.     Capillary Refill: Capillary refill takes less than 2 seconds.  Neurological:     Mental Status: She is alert and oriented to person, place, and time.     GCS: GCS eye subscore is 4. GCS verbal subscore is 5. GCS motor subscore is 6.  Psychiatric:        Mood and Affect: Mood normal.        Behavior: Behavior normal.     ED Results and Treatments Labs (all labs ordered are listed, but only abnormal results are displayed) Labs Reviewed  CBC WITH DIFFERENTIAL/PLATELET - Abnormal; Notable for the following components:      Result Value   WBC 12.6 (*)    Neutro Abs 9.6 (*)    All other components within normal  limits  URINALYSIS, ROUTINE W REFLEX MICROSCOPIC - Abnormal; Notable for the following components:   Color, Urine COLORLESS (*)     Leukocytes,Ua SMALL (*)    Bacteria, UA RARE (*)    All other components within normal limits  RESP PANEL BY RT-PCR (RSV, FLU A&B, COVID)  RVPGX2  COMPREHENSIVE METABOLIC PANEL  LIPASE, BLOOD  PREGNANCY, URINE  RAPID URINE DRUG SCREEN, HOSP PERFORMED                                                                                                                          Radiology CT ABDOMEN PELVIS W CONTRAST  Result Date: 08/06/2022 CLINICAL DATA:  Epigastric pain, suprapubic pain and right upper quadrant pain. EXAM: CT ABDOMEN AND PELVIS WITH CONTRAST TECHNIQUE: Multidetector CT imaging of the abdomen and pelvis was performed using the standard protocol following bolus administration of intravenous contrast. RADIATION DOSE REDUCTION: This exam was performed according to the departmental dose-optimization program which includes automated exposure control, adjustment of the mA and/or kV according to patient size and/or use of iterative reconstruction technique. CONTRAST:  136mL OMNIPAQUE IOHEXOL 300 MG/ML  SOLN COMPARISON:  U/S abdomen 07/15/2018 FINDINGS: Lower chest: No acute abnormality. Hepatobiliary: No focal liver abnormality is seen. No gallstones, gallbladder wall thickening, or biliary dilatation. Pancreas: Unremarkable. No pancreatic ductal dilatation or surrounding inflammatory changes. Spleen: Normal in size without focal abnormality. Adrenals/Urinary Tract: Adrenal glands are unremarkable. Kidneys are normal, without renal calculi, focal lesion, or hydronephrosis. Bladder is unremarkable. Stomach/Bowel: Stomach is within normal limits. The appendix is identified in the right lower quadrant of the abdomen, appears thickened with surrounding inflammatory fat stranding. The appendiceal diameter measures 1.1 cm. No signs of perforation or abscess. Tiny appendicolith noted, image 63/5 and image 68/2. No bowel wall thickening, inflammation, or distension. Vascular/Lymphatic: No significant vascular  findings are present. No enlarged abdominal or pelvic lymph nodes. Reproductive: Uterus and bilateral adnexa are unremarkable. Other: No free fluid or fluid collections.  No pneumoperitoneum. Musculoskeletal: No acute or significant osseous findings. IMPRESSION: Thickened appendix with surrounding inflammatory fat stranding concerning for acute appendicitis. No signs of perforation or periappendiceal abscess. Electronically Signed   By: Kerby Moors M.D.   On: 08/06/2022 10:37    Pertinent labs & imaging results that were available during my care of the patient were reviewed by me and considered in my medical decision making (see MDM for details).  Medications Ordered in ED Medications  cefTRIAXone (ROCEPHIN) 2 g in sodium chloride 0.9 % 100 mL IVPB (2 g Intravenous New Bag/Given 08/06/22 1124)  metroNIDAZOLE (FLAGYL) IVPB 500 mg (has no administration in time range)  alum & mag hydroxide-simeth (MAALOX/MYLANTA) 200-200-20 MG/5ML suspension 30 mL (30 mLs Oral Given 08/06/22 0925)    And  lidocaine (XYLOCAINE) 2 % viscous mouth solution 15 mL (15 mLs Oral Given 08/06/22 0925)  famotidine (PEPCID) IVPB 20 mg premix (0 mg Intravenous Stopped 08/06/22 1042)  sodium chloride 0.9 % bolus 1,000 mL (0  mLs Intravenous Stopped 08/06/22 1042)  ketorolac (TORADOL) 30 MG/ML injection 30 mg (30 mg Intravenous Given 08/06/22 0925)  iohexol (OMNIPAQUE) 300 MG/ML solution 100 mL (100 mLs Intravenous Contrast Given 08/06/22 1018)                                                                                                                                     Procedures .Critical Care  Performed by: Sloan Leiter, DO Authorized by: Sloan Leiter, DO   Critical care provider statement:    Critical care time (minutes):  30   Critical care time was exclusive of:  Separately billable procedures and treating other patients   Critical care was necessary to treat or prevent imminent or life-threatening deterioration of the  following conditions: intraabdominal infection requiring urgent surgery and 2x abx.   Critical care was time spent personally by me on the following activities:  Development of treatment plan with patient or surrogate, discussions with consultants, evaluation of patient's response to treatment, examination of patient, ordering and review of laboratory studies, ordering and review of radiographic studies, ordering and performing treatments and interventions, pulse oximetry, re-evaluation of patient's condition, review of old charts and obtaining history from patient or surrogate   Care discussed with: admitting provider     (including critical care time)  Medical Decision Making / ED Course   MDM:  Mary Carlson is a 22 y.o. female with past medical history as below, significant for anxiety, depression, MDD who presents to the ED with complaint of abdominal pain.. The complaint involves an extensive differential diagnosis and also carries with it a high risk of complications and morbidity.  Serious etiology was considered. Ddx includes but is not limited to: Differential diagnosis includes but is not exclusive to acute cholecystitis, intrathoracic causes for epigastric abdominal pain, gastritis, duodenitis, pancreatitis, small bowel or large bowel obstruction, abdominal aortic aneurysm, hernia, gastritis, etc.  Differential diagnosis includes but is not exclusive to ectopic pregnancy, ovarian cyst, ovarian torsion, acute appendicitis, urinary tract infection, endometriosis, bowel obstruction, hernia, colitis, renal colic, gastroenteritis, volvulus etc.   On initial assessment the patient is: She is resting comfortably, no acute distress, not hypoxic.  Afebrile. Vital signs and nursing notes were reviewed  Clinical Course as of 08/06/22 1129  Sat Aug 06, 2022  1006 Ambulated to restroom w/o difficulty [SG]  1104 CT w/ acute appendicitis, she is not septic, will d/w gen surg, start abx [SG]     Clinical Course User Index [SG] Sloan Leiter, DO   22 year old female with history as above to the ED with abdominal pain.  Abdominal pain is diffuse, epigastric, right upper quadrant, suprapubic.  Also having some dysuria.  Will obtain screening labs give fluids and analgesics.  Collect CT imaging of the abdomen given multiple areas of discomfort. WBC mildly elevated at 12.6, she is not septic, afebrile. D/w gen surg on call Dr Cliffton Asters who  recommends sending the pt ED to ED for urgent surgical eval. I did start rocephin/flagyl. She is NPO. Spoke with Dr Philip Aspen at [facility] who accepts pt for ED to ED transfer. Advised to contact gen surg on arrival. Surgery will be primary.    Additional history obtained: -Additional history obtained from mother -External records from outside source obtained and reviewed including: Chart review including previous notes, labs, imaging, consultation notes including primary care documentation, prior labs and imaging, home medications Reviewed ultrasound abdomen from 2022 which was negative  Lab Tests: -I ordered, reviewed, and interpreted labs.   The pertinent results include:   Labs Reviewed  CBC WITH DIFFERENTIAL/PLATELET - Abnormal; Notable for the following components:      Result Value   WBC 12.6 (*)    Neutro Abs 9.6 (*)    All other components within normal limits  URINALYSIS, ROUTINE W REFLEX MICROSCOPIC - Abnormal; Notable for the following components:   Color, Urine COLORLESS (*)    Leukocytes,Ua SMALL (*)    Bacteria, UA RARE (*)    All other components within normal limits  RESP PANEL BY RT-PCR (RSV, FLU A&B, COVID)  RVPGX2  COMPREHENSIVE METABOLIC PANEL  LIPASE, BLOOD  PREGNANCY, URINE  RAPID URINE DRUG SCREEN, HOSP PERFORMED    Notable for leukocytosis  EKG   EKG Interpretation  Date/Time:  Saturday August 06 2022 08:28:41 EST Ventricular Rate:  86 PR Interval:  210 QRS Duration: 82 QT Interval:  366 QTC Calculation: 438 R  Axis:   77 Text Interpretation: Sinus rhythm Prolonged PR interval Probable left atrial enlargement no stemi Interpretation limited secondary to artifact Baseline wander Confirmed by Wynona Dove ([id number]) on 08/06/2022 8:38:11 AM         Imaging Studies ordered: I ordered imaging studies including CTAP I independently visualized the following imaging with scope of interpretation limited to determining acute life threatening conditions related to emergency care: appendicitis  I independently visualized and interpreted imaging. I agree with the radiologist interpretation   Medicines ordered and prescription drug management: Meds ordered this encounter  Medications   AND Linked Order Group    alum & mag hydroxide-simeth (MAALOX/MYLANTA) 200-200-20 MG/5ML suspension 30 mL    lidocaine (XYLOCAINE) 2 % viscous mouth solution 15 mL   famotidine (PEPCID) IVPB 20 mg premix   sodium chloride 0.9 % bolus 1,000 mL   ketorolac (TORADOL) 30 MG/ML injection 30 mg   iohexol (OMNIPAQUE) 300 MG/ML solution 100 mL   cefTRIAXone (ROCEPHIN) 2 g in sodium chloride 0.9 % 100 mL IVPB    Order Specific Question:   Antibiotic Indication:    Answer:   Intra-abdominal   metroNIDAZOLE (FLAGYL) IVPB 500 mg    Order Specific Question:   Antibiotic Indication:    Answer:   Intra-abdominal Infection    Order Specific Question:   Other Indication:    Answer:   appy    -I have reviewed the patients home medicines and have made adjustments as needed   Consultations Obtained: I requested consultation with the dr white gen surg,  and discussed lab and imaging findings as well as pertinent plan - they recommend: send to ed surg   Cardiac Monitoring: The patient was maintained on a cardiac monitor.  I personally viewed and interpreted the cardiac monitored which showed an underlying rhythm of: nsr  Social Determinants of Health:  Diagnosis or treatment significantly limited by social determinants of health: former  smoker and obesity   Reevaluation: After the interventions noted  above, I reevaluated the patient and found that they have improved  Co morbidities that complicate the patient evaluation  Past Medical History:  Diagnosis Date   Anxiety    Depression    History of chicken pox       Dispostion: Disposition decision including need for hospitalization was considered, and patient transferred.    Final Clinical Impression(s) / ED Diagnoses Final diagnoses:  Acute appendicitis, unspecified acute appendicitis type     This chart was dictated using voice recognition software.  Despite best efforts to proofread,  errors can occur which can change the documentation meaning.    Jeanell Sparrow, DO 08/06/22 1129

## 2022-08-06 NOTE — Anesthesia Postprocedure Evaluation (Signed)
Anesthesia Post Note  Patient: Mary Carlson  Procedure(s) Performed: APPENDECTOMY LAPAROSCOPIC (Abdomen)     Patient location during evaluation: PACU Anesthesia Type: General Level of consciousness: sedated and patient cooperative Pain management: pain level controlled Vital Signs Assessment: post-procedure vital signs reviewed and stable Respiratory status: spontaneous breathing Cardiovascular status: stable Anesthetic complications: no   No notable events documented.  Last Vitals:  Vitals:   08/06/22 1715 08/06/22 1730  BP: 120/67 119/66  Pulse: 68 67  Resp:    Temp:  37 C  SpO2: 96% 96%    Last Pain:  Vitals:   08/06/22 1730  TempSrc:   PainSc: [place]

## 2022-08-06 NOTE — Anesthesia Procedure Notes (Signed)
Procedure Name: Intubation Date/Time: 08/06/2022 3:06 PM  Performed by: Josephine Igo, CRNAPre-anesthesia Checklist: Patient identified, Emergency Drugs available, Suction available and Patient being monitored Patient Re-evaluated:Patient Re-evaluated prior to induction Oxygen Delivery Method: Circle system utilized Preoxygenation: Pre-oxygenation with 100% oxygen Induction Type: IV induction, Rapid sequence and Cricoid Pressure applied Ventilation: Mask ventilation without difficulty Laryngoscope Size: Miller and 2 Grade View: Grade I Tube type: Oral Tube size: 7.0 mm Number of attempts: 1 Airway Equipment and Method: Stylet Placement Confirmation: ETT inserted through vocal cords under direct vision and positive ETCO2 Secured at: 22 cm Tube secured with: Tape Dental Injury: Teeth and Oropharynx as per pre-operative assessment

## 2022-08-06 NOTE — ED Triage Notes (Signed)
Middle abdominal pain for 2 -3 days. Pt thought she may be constipated, took miralax and had bm yesterday, pain still there. Sharp pain, like a stabbing pain. Pelvis area pain with urination as well. No fevers

## 2022-08-06 NOTE — ED Notes (Signed)
Roselyn Reef, R.N., C.N. at [facility] E.D. is aware of transfer. [facility] leaves with her from here at this time. Pt's mom is with her and is instructed where to go by the [facility] crew. She is medicated for pain before transfer.

## 2022-08-06 NOTE — ED Notes (Signed)
Called [facility] -- informed of the Ed to Ed tx via [facility]; accepting: Margaretmary Eddy, MD

## 2022-08-06 NOTE — ED Provider Notes (Signed)
  Physical Exam  BP 127/72 (BP Location: Right Arm)   Pulse 80   Temp 98.6 F (37 C) (Oral)   Resp 16   Ht 5\' 5"  (1.651 m)   Wt 118.8 kg   SpO2 97%   BMI 43.60 kg/m   Physical Exam Vitals and nursing note reviewed.  Constitutional:      Appearance: Normal appearance.  HENT:     Head: Normocephalic and atraumatic.  Eyes:     Conjunctiva/sclera: Conjunctivae normal.  Pulmonary:     Effort: Pulmonary effort is normal. No respiratory distress.  Skin:    General: Skin is warm and dry.  Neurological:     Mental Status: She is alert.  Psychiatric:        Mood and Affect: Mood normal.        Behavior: Behavior normal.    ED Course / MDM   Clinical Course as of 08/06/22 1308  Sat Aug 06, 2022  1006 Ambulated to restroom w/o difficulty [SG]  1104 CT w/ acute appendicitis, she is not septic, will d/w gen surg, start abx [SG]    Clinical Course User Index [SG] Jeanell Sparrow, DO   Medical Decision Making Amount and/or Complexity of Data Reviewed Labs: ordered. Radiology: ordered.  Risk OTC drugs. Prescription drug management.  Patient transferred from [facility] ED for acute appendicitis per general surgery recommendation. Dr Dema Severin with general surgery made aware and he will come to evaluate the patient. Taken to the OR in stable condition.    Estill Cotta 08/06/22 1410    Blanchie Dessert, MD 08/07/22 1116

## 2022-08-06 NOTE — Consult Note (Signed)
CC: RLQ pain, appendicitis  HPI: Mary Carlson is an 22 y.o. female with hx of anxiety and depression presented to [facility] with a ~3d hx of progressive RLQ pain with some radiation to central abdomen as well. No n/v. Was worried she was constipated and tried laxative without alleviation despite BM. Denies fever/chills; +nausea, no emesis. Never had this kind of pain before.  PSH: Denies any prior surgical hx  Past Medical History:  Diagnosis Date   Anxiety    Depression    History of chicken pox     Past Surgical History:  Procedure Laterality Date   TONSILLECTOMY N/A 03/02/2019    Family History  Problem Relation Age of Onset   Cancer Maternal Grandmother    Hypertension Paternal Grandmother    Hyperlipidemia Paternal Grandmother    Diabetes Paternal Grandmother    Hypertension Paternal Grandfather    Hyperlipidemia Paternal Grandfather     Social:  reports that she quit smoking about 21 months ago. Her smoking use included e-cigarettes. She has never used smokeless tobacco. She reports current alcohol use of about 15.0 standard drinks of alcohol per week. She reports that she does not use drugs.  Allergies:  Allergies  Allergen Reactions   Food Itching    Walnuts    Other Anaphylaxis and Other (See Comments)    Walnuts & other tree nuts   Watermelon [Citrullus Vulgaris] Other (See Comments)    Itchy throat   Banana Rash    Medications: I have reviewed the patient's current medications.  Results for orders placed or performed during the hospital encounter of 08/06/22 (from the past 48 hour(s))  CBC with Differential     Status: Abnormal   Collection Time: 08/06/22  9:10 AM  Result Value Ref Range   WBC 12.6 (H) 4.0 - 10.5 K/uL   RBC 4.68 3.87 - 5.11 MIL/uL   Hemoglobin 13.0 12.0 - 15.0 g/dL   HCT 82.9 93.7 - 16.9 %   MCV 84.4 80.0 - 100.0 fL   MCH 27.8 26.0 - 34.0 pg   MCHC 32.9 30.0 - 36.0 g/dL   RDW 67.8 93.8 - 10.1 %   Platelets 248 150 - 400 K/uL    nRBC 0.0 0.0 - 0.2 %   Neutrophils Relative % 76 %   Neutro Abs 9.6 (H) 1.7 - 7.7 K/uL   Lymphocytes Relative 16 %   Lymphs Abs 2.0 0.7 - 4.0 K/uL   Monocytes Relative 7 %   Monocytes Absolute 0.8 0.1 - 1.0 K/uL   Eosinophils Relative 1 %   Eosinophils Absolute 0.1 0.0 - 0.5 K/uL   Basophils Relative 0 %   Basophils Absolute 0.0 0.0 - 0.1 K/uL   Immature Granulocytes 0 %   Abs Immature Granulocytes 0.03 0.00 - 0.07 K/uL    Comment: Performed at [facility], [address], [place], [place] [address]  Comprehensive metabolic panel     Status: None   Collection Time: 08/06/22  9:10 AM  Result Value Ref Range   Sodium 139 135 - 145 mmol/L   Potassium 3.9 3.5 - 5.1 mmol/L   Chloride 106 98 - 111 mmol/L   CO2 23 22 - 32 mmol/L   Glucose, Bld 87 70 - 99 mg/dL    Comment: Glucose reference range applies only to samples taken after fasting for at least 8 hours.   BUN 11 6 - 20 mg/dL   Creatinine, Ser 5.85 0.44 - 1.00 mg/dL   Calcium 9.4  8.9 - 10.3 mg/dL   Total Protein 7.7 6.5 - 8.1 g/dL   Albumin 4.4 3.5 - 5.0 g/dL   AST 15 15 - 41 U/L   ALT 18 0 - 44 U/L   Alkaline Phosphatase 50 38 - 126 U/L   Total Bilirubin 0.3 0.3 - 1.2 mg/dL   GFR, Estimated >44 >31 mL/min    Comment: (NOTE) Calculated using the CKD-EPI Creatinine Equation (2021)    Anion gap 10 5 - 15    Comment: Performed at [facility], [address], [place], [place] [address]  Lipase, blood     Status: None   Collection Time: 08/06/22  9:10 AM  Result Value Ref Range   Lipase 17 11 - 51 U/L    Comment: Performed at [facility], [address], [place], [place] [address]  Resp panel by RT-PCR (RSV, Flu A&B, Covid) Anterior Nasal Swab     Status: None   Collection Time: 08/06/22  9:10 AM   Specimen: Anterior Nasal Swab  Result Value Ref Range   SARS Coronavirus 2 by RT PCR NEGATIVE NEGATIVE    Comment: (NOTE) SARS-CoV-2 target nucleic acids are NOT  DETECTED.  The SARS-CoV-2 RNA is generally detectable in upper respiratory specimens during the acute phase of infection. The lowest concentration of SARS-CoV-2 viral copies this assay can detect is 138 copies/mL. A negative result does not preclude SARS-Cov-2 infection and should not be used as the sole basis for treatment or other patient management decisions. A negative result may occur with  improper specimen collection/handling, submission of specimen other than nasopharyngeal swab, presence of viral mutation(s) within the areas targeted by this assay, and inadequate number of viral copies(<138 copies/mL). A negative result must be combined with clinical observations, patient history, and epidemiological information. The expected result is Negative.  Fact Sheet for Patients:  BloggerCourse.com  Fact Sheet for Healthcare Providers:  SeriousBroker.it  This test is no t yet approved or cleared by the [place] FDA and  has been authorized for detection and/or diagnosis of SARS-CoV-2 by FDA under an Emergency Use Authorization (EUA). This EUA will remain  in effect (meaning this test can be used) for the duration of the COVID-19 declaration under Section 564(b)(1) of the Act, 21 U.S.C.section 360bbb-3(b)(1), unless the authorization is terminated  or revoked sooner.       Influenza A by PCR NEGATIVE NEGATIVE   Influenza B by PCR NEGATIVE NEGATIVE    Comment: (NOTE) The Xpert Xpress SARS-CoV-2/FLU/RSV plus assay is intended as an aid in the diagnosis of influenza from Nasopharyngeal swab specimens and should not be used as a sole basis for treatment. Nasal washings and aspirates are unacceptable for Xpert Xpress SARS-CoV-2/FLU/RSV testing.  Fact Sheet for Patients: BloggerCourse.com  Fact Sheet for Healthcare Providers: SeriousBroker.it  This test is not yet approved or  cleared by the [place] FDA and has been authorized for detection and/or diagnosis of SARS-CoV-2 by FDA under an Emergency Use Authorization (EUA). This EUA will remain in effect (meaning this test can be used) for the duration of the COVID-19 declaration under Section 564(b)(1) of the Act, 21 U.S.C. section 360bbb-3(b)(1), unless the authorization is terminated or revoked.     Resp Syncytial Virus by PCR NEGATIVE NEGATIVE    Comment: (NOTE) Fact Sheet for Patients: BloggerCourse.com  Fact Sheet for Healthcare Providers: SeriousBroker.it  This test is not yet approved or cleared by the [place] FDA and has been authorized for detection and/or diagnosis  of SARS-CoV-2 by FDA under an Emergency Use Authorization (EUA). This EUA will remain in effect (meaning this test can be used) for the duration of the COVID-19 declaration under Section 564(b)(1) of the Act, 21 U.S.C. section 360bbb-3(b)(1), unless the authorization is terminated or revoked.  Performed at [facility], [address], [place], [place] [address]   Urinalysis, Routine w reflex microscopic -Urine, Clean Catch     Status: Abnormal   Collection Time: 08/06/22  9:48 AM  Result Value Ref Range   Color, Urine COLORLESS (A) YELLOW   APPearance CLEAR CLEAR   Specific Gravity, Urine 1.007 1.005 - 1.030   pH 7.0 5.0 - 8.0   Glucose, UA NEGATIVE NEGATIVE mg/dL   Hgb urine dipstick NEGATIVE NEGATIVE   Bilirubin Urine NEGATIVE NEGATIVE   Ketones, ur NEGATIVE NEGATIVE mg/dL   Protein, ur NEGATIVE NEGATIVE mg/dL   Nitrite NEGATIVE NEGATIVE   Leukocytes,Ua SMALL (A) NEGATIVE   RBC / HPF 0-5 0 - 5 RBC/hpf   WBC, UA 0-5 0 - 5 WBC/hpf   Bacteria, UA RARE (A) NONE SEEN   Squamous Epithelial / HPF 0-5 0 - 5 /HPF   Mucus PRESENT     Comment: Performed at [facility], [address], [place], [place] [address]  Pregnancy, urine      Status: None   Collection Time: 08/06/22  9:48 AM  Result Value Ref Range   Preg Test, Ur NEGATIVE NEGATIVE    Comment:        THE SENSITIVITY OF THIS METHODOLOGY IS >20 mIU/mL. Performed at [facility], [address], [place], [place] [address]   Rapid urine drug screen (hospital performed)     Status: None   Collection Time: 08/06/22 10:44 AM  Result Value Ref Range   Opiates NONE DETECTED NONE DETECTED   Cocaine NONE DETECTED NONE DETECTED   Benzodiazepines NONE DETECTED NONE DETECTED   Amphetamines NONE DETECTED NONE DETECTED   Tetrahydrocannabinol NONE DETECTED NONE DETECTED   Barbiturates NONE DETECTED NONE DETECTED    Comment: (NOTE) DRUG SCREEN FOR MEDICAL PURPOSES ONLY.  IF CONFIRMATION IS NEEDED FOR ANY PURPOSE, NOTIFY LAB WITHIN 5 DAYS.  LOWEST DETECTABLE LIMITS FOR URINE DRUG SCREEN Drug Class                     Cutoff (ng/mL) Amphetamine and metabolites    1000 Barbiturate and metabolites    200 Benzodiazepine                 200 Opiates and metabolites        300 Cocaine and metabolites        300 THC                            50 Performed at [facility], [address], [place], [place] [address]     CT ABDOMEN PELVIS W CONTRAST  Result Date: 08/06/2022 CLINICAL DATA:  Epigastric pain, suprapubic pain and right upper quadrant pain. EXAM: CT ABDOMEN AND PELVIS WITH CONTRAST TECHNIQUE: Multidetector CT imaging of the abdomen and pelvis was performed using the standard protocol following bolus administration of intravenous contrast. RADIATION DOSE REDUCTION: This exam was performed according to the departmental dose-optimization program which includes automated exposure control, adjustment of the mA and/or kV according to patient size and/or use of iterative reconstruction technique. CONTRAST:  100mL OMNIPAQUE IOHEXOL 300 MG/ML  SOLN COMPARISON:  U/S abdomen  07/15/2018 FINDINGS: Lower chest: No acute abnormality.  Hepatobiliary: No focal liver abnormality is seen. No gallstones, gallbladder wall thickening, or biliary dilatation. Pancreas: Unremarkable. No pancreatic ductal dilatation or surrounding inflammatory changes. Spleen: Normal in size without focal abnormality. Adrenals/Urinary Tract: Adrenal glands are unremarkable. Kidneys are normal, without renal calculi, focal lesion, or hydronephrosis. Bladder is unremarkable. Stomach/Bowel: Stomach is within normal limits. The appendix is identified in the right lower quadrant of the abdomen, appears thickened with surrounding inflammatory fat stranding. The appendiceal diameter measures 1.1 cm. No signs of perforation or abscess. Tiny appendicolith noted, image 63/5 and image 68/2. No bowel wall thickening, inflammation, or distension. Vascular/Lymphatic: No significant vascular findings are present. No enlarged abdominal or pelvic lymph nodes. Reproductive: Uterus and bilateral adnexa are unremarkable. Other: No free fluid or fluid collections.  No pneumoperitoneum. Musculoskeletal: No acute or significant osseous findings. IMPRESSION: Thickened appendix with surrounding inflammatory fat stranding concerning for acute appendicitis. No signs of perforation or periappendiceal abscess. Electronically Signed   By: Signa Kell M.D.   On: 08/06/2022 10:37    PE Blood pressure (!) 129/58, pulse 66, temperature 98.6 F (37 C), temperature source Oral, resp. rate 16, height 5\' 5"  (1.651 m), weight 118.8 kg, SpO2 100 %. Constitutional: NAD; conversant Eyes: Moist conjunctiva; no lid lag; anicteric Lungs: Normal respiratory effort CV: RRR GI: Abd soft, ttp in RLQ without rebound/guarding; not significantly distended MSK: Normal range of motion of extremities Psychiatric: Appropriate affect  Results for orders placed or performed during the hospital encounter of 08/06/22 (from the past 48 hour(s))  CBC with Differential     Status: Abnormal   Collection Time: 08/06/22   9:10 AM  Result Value Ref Range   WBC 12.6 (H) 4.0 - 10.5 K/uL   RBC 4.68 3.87 - 5.11 MIL/uL   Hemoglobin 13.0 12.0 - 15.0 g/dL   HCT 10/05/22 04.5 - 40.9 %   MCV 84.4 80.0 - 100.0 fL   MCH 27.8 26.0 - 34.0 pg   MCHC 32.9 30.0 - 36.0 g/dL   RDW 81.1 91.4 - 78.2 %   Platelets 248 150 - 400 K/uL   nRBC 0.0 0.0 - 0.2 %   Neutrophils Relative % 76 %   Neutro Abs 9.6 (H) 1.7 - 7.7 K/uL   Lymphocytes Relative 16 %   Lymphs Abs 2.0 0.7 - 4.0 K/uL   Monocytes Relative 7 %   Monocytes Absolute 0.8 0.1 - 1.0 K/uL   Eosinophils Relative 1 %   Eosinophils Absolute 0.1 0.0 - 0.5 K/uL   Basophils Relative 0 %   Basophils Absolute 0.0 0.0 - 0.1 K/uL   Immature Granulocytes 0 %   Abs Immature Granulocytes 0.03 0.00 - 0.07 K/uL    Comment: Performed at [facility], [address] [place], [place]  Comprehensive metabolic panel     Status: None   Collection Time: 08/06/22  9:10 AM  Result Value Ref Range   Sodium 139 135 - 145 mmol/L   Potassium 3.9 3.5 - 5.1 mmol/L   Chloride 106 98 - 111 mmol/L   CO2 23 22 - 32 mmol/L   Glucose, Bld 87 70 - 99 mg/dL    Comment: Glucose reference range applies only to samples taken after fasting for at least 8 hours.   BUN 11 6 - 20 mg/dL   Creatinine, Ser 10/05/22 0.44 - 1.00 mg/dL   Calcium 9.4 8.9 - 6.57 mg/dL   Total Protein 7.7 6.5 - 8.1 g/dL  Albumin 4.4 3.5 - 5.0 g/dL   AST 15 15 - 41 U/L   ALT 18 0 - 44 U/L   Alkaline Phosphatase 50 38 - 126 U/L   Total Bilirubin 0.3 0.3 - 1.2 mg/dL   GFR, Estimated >60 >60 mL/min    Comment: (NOTE) Calculated using the CKD-EPI Creatinine Equation (2021)    Anion gap 10 5 - 15    Comment: Performed at [facility], [address], [place], [place] [address]  Lipase, blood     Status: None   Collection Time: 08/06/22  9:10 AM  Result Value Ref Range   Lipase 17 11 - 51 U/L    Comment: Performed at [facility], [address]  [place], [place] [address]  Resp panel by RT-PCR (RSV, Flu A&B, Covid) Anterior Nasal Swab     Status: None   Collection Time: 08/06/22  9:10 AM   Specimen: Anterior Nasal Swab  Result Value Ref Range   SARS Coronavirus 2 by RT PCR NEGATIVE NEGATIVE    Comment: (NOTE) SARS-CoV-2 target nucleic acids are NOT DETECTED.  The SARS-CoV-2 RNA is generally detectable in upper respiratory specimens during the acute phase of infection. The lowest concentration of SARS-CoV-2 viral copies this assay can detect is 138 copies/mL. A negative result does not preclude SARS-Cov-2 infection and should not be used as the sole basis for treatment or other patient management decisions. A negative result may occur with  improper specimen collection/handling, submission of specimen other than nasopharyngeal swab, presence of viral mutation(s) within the areas targeted by this assay, and inadequate number of viral copies(<138 copies/mL). A negative result must be combined with clinical observations, patient history, and epidemiological information. The expected result is Negative.  Fact Sheet for Patients:  EntrepreneurPulse.com.au  Fact Sheet for Healthcare Providers:  IncredibleEmployment.be  This test is no t yet approved or cleared by the [place] FDA and  has been authorized for detection and/or diagnosis of SARS-CoV-2 by FDA under an Emergency Use Authorization (EUA). This EUA will remain  in effect (meaning this test can be used) for the duration of the COVID-19 declaration under Section 564(b)(1) of the Act, 21 U.S.C.section 360bbb-3(b)(1), unless the authorization is terminated  or revoked sooner.       Influenza A by PCR NEGATIVE NEGATIVE   Influenza B by PCR NEGATIVE NEGATIVE    Comment: (NOTE) The Xpert Xpress SARS-CoV-2/FLU/RSV plus assay is intended as an aid in the diagnosis of influenza from Nasopharyngeal swab specimens and should not be used as a  sole basis for treatment. Nasal washings and aspirates are unacceptable for Xpert Xpress SARS-CoV-2/FLU/RSV testing.  Fact Sheet for Patients: EntrepreneurPulse.com.au  Fact Sheet for Healthcare Providers: IncredibleEmployment.be  This test is not yet approved or cleared by the [place] FDA and has been authorized for detection and/or diagnosis of SARS-CoV-2 by FDA under an Emergency Use Authorization (EUA). This EUA will remain in effect (meaning this test can be used) for the duration of the COVID-19 declaration under Section 564(b)(1) of the Act, 21 U.S.C. section 360bbb-3(b)(1), unless the authorization is terminated or revoked.     Resp Syncytial Virus by PCR NEGATIVE NEGATIVE    Comment: (NOTE) Fact Sheet for Patients: EntrepreneurPulse.com.au  Fact Sheet for Healthcare Providers: IncredibleEmployment.be  This test is not yet approved or cleared by the [place] FDA and has been authorized for detection and/or diagnosis of SARS-CoV-2 by FDA under an Emergency Use Authorization (EUA). This EUA will remain in  effect (meaning this test can be used) for the duration of the COVID-19 declaration under Section 564(b)(1) of the Act, 21 U.S.C. section 360bbb-3(b)(1), unless the authorization is terminated or revoked.  Performed at [facility], [address] [place], [place] [address]   Urinalysis, Routine w reflex microscopic -Urine, Clean Catch     Status: Abnormal   Collection Time: 08/06/22  9:48 AM  Result Value Ref Range   Color, Urine COLORLESS (A) YELLOW   APPearance CLEAR CLEAR   Specific Gravity, Urine 1.007 1.005 - 1.030   pH 7.0 5.0 - 8.0   Glucose, UA NEGATIVE NEGATIVE mg/dL   Hgb urine dipstick NEGATIVE NEGATIVE   Bilirubin Urine NEGATIVE NEGATIVE   Ketones, ur NEGATIVE NEGATIVE mg/dL   Protein, ur NEGATIVE NEGATIVE mg/dL   Nitrite NEGATIVE NEGATIVE    Leukocytes,Ua SMALL (A) NEGATIVE   RBC / HPF 0-5 0 - 5 RBC/hpf   WBC, UA 0-5 0 - 5 WBC/hpf   Bacteria, UA RARE (A) NONE SEEN   Squamous Epithelial / HPF 0-5 0 - 5 /HPF   Mucus PRESENT     Comment: Performed at [facility], [address], [place], [place] [address]  Pregnancy, urine     Status: None   Collection Time: 08/06/22  9:48 AM  Result Value Ref Range   Preg Test, Ur NEGATIVE NEGATIVE    Comment:        THE SENSITIVITY OF THIS METHODOLOGY IS >20 mIU/mL. Performed at [facility], [address], [place], [place] [address]   Rapid urine drug screen (hospital performed)     Status: None   Collection Time: 08/06/22 10:44 AM  Result Value Ref Range   Opiates NONE DETECTED NONE DETECTED   Cocaine NONE DETECTED NONE DETECTED   Benzodiazepines NONE DETECTED NONE DETECTED   Amphetamines NONE DETECTED NONE DETECTED   Tetrahydrocannabinol NONE DETECTED NONE DETECTED   Barbiturates NONE DETECTED NONE DETECTED    Comment: (NOTE) DRUG SCREEN FOR MEDICAL PURPOSES ONLY.  IF CONFIRMATION IS NEEDED FOR ANY PURPOSE, NOTIFY LAB WITHIN 5 DAYS.  LOWEST DETECTABLE LIMITS FOR URINE DRUG SCREEN Drug Class                     Cutoff (ng/mL) Amphetamine and metabolites    1000 Barbiturate and metabolites    200 Benzodiazepine                 200 Opiates and metabolites        300 Cocaine and metabolites        300 THC                            50 Performed at [facility], [address], [place],  [address]     CT ABDOMEN PELVIS W CONTRAST  Result Date: 08/06/2022 CLINICAL DATA:  Epigastric pain, suprapubic pain and right upper quadrant pain. EXAM: CT ABDOMEN AND PELVIS WITH CONTRAST TECHNIQUE: Multidetector CT imaging of the abdomen and pelvis was performed using the standard protocol following bolus administration of intravenous contrast. RADIATION DOSE REDUCTION: This exam was performed according to the departmental  dose-optimization program which includes automated exposure control, adjustment of the mA and/or kV according to patient size and/or use of iterative reconstruction technique. CONTRAST:  154mL OMNIPAQUE IOHEXOL 300 MG/ML  SOLN COMPARISON:  U/S abdomen 07/15/2018 FINDINGS: Lower chest: No acute abnormality. Hepatobiliary: No focal liver abnormality is seen.  No gallstones, gallbladder wall thickening, or biliary dilatation. Pancreas: Unremarkable. No pancreatic ductal dilatation or surrounding inflammatory changes. Spleen: Normal in size without focal abnormality. Adrenals/Urinary Tract: Adrenal glands are unremarkable. Kidneys are normal, without renal calculi, focal lesion, or hydronephrosis. Bladder is unremarkable. Stomach/Bowel: Stomach is within normal limits. The appendix is identified in the right lower quadrant of the abdomen, appears thickened with surrounding inflammatory fat stranding. The appendiceal diameter measures 1.1 cm. No signs of perforation or abscess. Tiny appendicolith noted, image 63/5 and image 68/2. No bowel wall thickening, inflammation, or distension. Vascular/Lymphatic: No significant vascular findings are present. No enlarged abdominal or pelvic lymph nodes. Reproductive: Uterus and bilateral adnexa are unremarkable. Other: No free fluid or fluid collections.  No pneumoperitoneum. Musculoskeletal: No acute or significant osseous findings. IMPRESSION: Thickened appendix with surrounding inflammatory fat stranding concerning for acute appendicitis. No signs of perforation or periappendiceal abscess. Electronically Signed   By: Kerby Moors M.D.   On: 08/06/2022 10:37     A/P: Mary Carlson is an 22 y.o. female with hx anxiety and depression here with acute appendicitis  -The anatomy and physiology of the GI tract was discussed at length with the patient and her mother at bedside. The pathophysiology of appendicitis was discussed as well. -We reviewed options moving forward for  treatment, covering IV abx vs surgery. We discussed that with antibiotics alone, there is reasonable success in managing appendicitis, however, risks of recurrence at 44yrs being as high as 40% in some studies. We discussed appendectomy - laparoscopic and potential open techniques as well as scenarios where an ileocecectomy could be necessary. We discussed the material risks (including, but not limited to, pain, bleeding, infection, scarring, need for blood transfusion, damage to surrounding structures- blood vessels/nerves/viscus/organs, damage to ureter/bladder, urine leak, leak from staple line, need for additional procedures, hernia, recurrence although quite low, pneumonia, heart attack, stroke, death) benefits and alternatives to surgery were discussed. The patient's questions were answered to her satisfaction, she voiced understanding and elected to proceed with surgery. Additionally, we discussed typical postoperative expectations and the recovery process.  I spent a total of 55 minutes in both face-to-face and non-face-to-face activities, excluding procedures performed, for this visit on the date of this encounter.  Nadeen Landau, [facility], [place]

## 2022-08-06 NOTE — Op Note (Signed)
Mary Carlson [id number]   PRE-OPERATIVE DIAGNOSIS:  Acute appendicitis  POST-OPERATIVE DIAGNOSIS:  Acute suppurative appendicitis without perforation or abscess   PROCEDURE: Laparoscopic appendectomy  SURGEON:  Sharon Mt. Cionna Collantes, M.D.  ASSISTANT: OR staff  ANESTHESIA: General endotracheal  EBL:   20 mL  DRAINS: None  SPECIMEN:  Appendix  COUNTS:  Sponge, needle and instrument counts were reported correct x2 at conclusion of the operation  DISPOSITION:  PACU in satisfactory condition  COMPLICATIONS: None  FINDINGS: Acute suppurative appendicitis without evident perforation or abscess.  DESCRIPTION:   The patient was identified & brought into the operating room. SCDs were in place and functioning. General endotracheal anesthesia was administered. Preoperative antibiotics were administered. The patient was positioned supine with left arm tucked. Hair on the abdomen was then clipped by the OR team. A foley catheter was inserted under sterile conditions. The abdomen was prepped and draped in the standard sterile fashion. A surgical timeout confirmed our plan.  Given body habitus, opted to proceed with a Veress needle entry technique.  An OG tube was placed by anesthesia and confirmed to be to suction.  At Palmer's point, a stab incision was created.  The Veress needle was introduced into the peritoneal cavity on the first attempt.  Intraperitoneal location was confirmed with the aspiration and saline drop test.  Pneumoperitoneum was then established with CO2 to a maximum pressure of 15 mmHg.  At Palmer's point, a 5 mm optical viewing trocar was then used to cautiously enter the abdomen under direct visualization.  The laparoscope was inserted and demonstrated no evidence of Veress needle or trocar site complication.  Local anesthetic was infiltrated around this port site.  The umbilical trocar site was anesthetized with 0.25% Marcaine.  An additional 12 mm trocar was then placed in  the infraumbilical position under direct visualization.  A 5 mm trocar was then placed in the left lower quadrant under direct visualization after anesthetizing the site as well. The bed was then slightly tilted to place the left side down.  Omentum was reflected cephalad.  The small bowel was reflected away from the right lower quadrant and the appendix was readily identified.  The terminal ileum was identified and well away from the appendiceal base.  The appendix was carefully separated from surrounding tissues bluntly without any difficulty.  There is no evident perforation or abscess but there is some suppuration locally.  The appendix was elevated.  The base of the appendix was circumferentially dissected taking care to preserve the cecum free of injury. The base was noted to be viable and healthy appearing. The terminal ileum, cecum and ascending colon also appeared normal. The base of the appendix was then stapled with a blue load, taking a small healthy cuff of viable cecum, taking care to stay clear of the ileocecal valve. The mesoappendix was then ligated by "hugging" the appendix using the harmonic scalpel. The mesoappendix was inspected and noted to be hemostatic. The appendix was placed in an EndoBag.  The right lower quadrant was conservatively irrigated. Hemostasis was noted to be achieved - taking time to inspect the ligated mesoappendix, colon mesentery, and retroperitoneum. Staple line was noted to be intact on the cecum with no bleeding. There was no perforation or injury. The right lower quadrant appeared clean and as such, no drain was placed.  The left lower quadrant and umbilical ports were removed under direct visualization. The EndoBag was then removed through the umbilical port site and passed off as specimen.  A  laparoscopic suture passer was then used and two 0 Vicryl sutures were placed to obliterate the umbilical trocar site defect and closed the fascia.  The CO2 was exhausted  from the abdomen. The left upper quadrant trocar was then removed.  All sponge, needle, and instrument counts were reported correct.  The skin of all port sites was then approximated using 4-0 Monocryl suture. The incisions were covered with Dermabond.  She was then awakened from general anesthesia, extubated, and transferred to a stretcher for transport to recovery in satisfactory condition.

## 2022-08-06 NOTE — Anesthesia Preprocedure Evaluation (Addendum)
Anesthesia Evaluation  Patient identified by MRN, date of birth, ID band Patient awake    Reviewed: Allergy & Precautions, NPO status , Patient's Chart, lab work & pertinent test results  Airway Mallampati: II  TM Distance: >3 FB Neck ROM: Full    Dental no notable dental hx. (+) Dental Advisory Given, Teeth Intact   Pulmonary former smoker   Pulmonary exam normal breath sounds clear to auscultation       Cardiovascular negative cardio ROS Normal cardiovascular exam Rhythm:Regular Rate:Normal     Neuro/Psych  PSYCHIATRIC DISORDERS Anxiety Depression    negative neurological ROS     GI/Hepatic negative GI ROS, Neg liver ROS,,,  Endo/Other  negative endocrine ROS    Renal/GU negative Renal ROS     Musculoskeletal negative musculoskeletal ROS (+)    Abdominal  (+) + obese  Peds  Hematology negative hematology ROS (+)   Anesthesia Other Findings   Reproductive/Obstetrics                             Anesthesia Physical Anesthesia Plan  ASA: 3 and emergent  Anesthesia Plan: General   Post-op Pain Management: Tylenol PO (pre-op)*   Induction: Intravenous, Rapid sequence and Cricoid pressure planned  PONV Risk Score and Plan: 4 or greater and Ondansetron, Dexamethasone, Treatment may vary due to age or medical condition and Midazolam  Airway Management Planned: Oral ETT  Additional Equipment:   Intra-op Plan:   Post-operative Plan: Extubation in OR  Informed Consent: I have reviewed the patients History and Physical, chart, labs and discussed the procedure including the risks, benefits and alternatives for the proposed anesthesia with the patient or authorized representative who has indicated his/her understanding and acceptance.     Dental advisory given  Plan Discussed with: CRNA  Anesthesia Plan Comments:         Anesthesia Quick Evaluation

## 2022-08-06 NOTE — Transfer of Care (Signed)
Immediate Anesthesia Transfer of Care Note  Patient: Mary Carlson  Procedure(s) Performed: APPENDECTOMY LAPAROSCOPIC (Abdomen)  Patient Location: PACU  Anesthesia Type:General  Level of Consciousness: drowsy and patient cooperative  Airway & Oxygen Therapy: Patient Spontanous Breathing  Post-op Assessment: Report given to RN and Post -op Vital signs reviewed and stable  Post vital signs: Reviewed and stable  Last Vitals:  Vitals Value Taken Time  BP 125/68 08/06/22 1630  Temp    Pulse 92 08/06/22 1630  Resp 13 08/06/22 1631  SpO2 91 % 08/06/22 1630  Vitals shown include unvalidated device data.  Last Pain:  Vitals:   08/06/22 1253  TempSrc: Oral  PainSc:          Complications: No notable events documented.

## 2022-08-06 NOTE — Discharge Instructions (Addendum)
POST OP INSTRUCTIONS  DIET: As tolerated. Follow a light bland diet the first 24 hours after arrival home, such as soup, liquids, crackers, etc.  Be sure to include lots of fluids daily.  Avoid fast food or heavy meals as your are more likely to get nauseated.  Eat a low fat the next few days after surgery.  Take your usually prescribed home medications unless otherwise directed.  PAIN CONTROL: Pain is best controlled by a usual combination of three different methods TOGETHER: Ice/Heat Over the counter pain medication Prescription pain medication Most patients will experience some swelling and bruising around the surgical site.  Ice packs or heating pads (30-60 minutes up to 6 times a day) will help. Some people prefer to use ice alone, heat alone, alternating between ice & heat.  Experiment to what works for you.  Swelling and bruising can take several weeks to resolve.   It is helpful to take an over-the-counter pain medication regularly for the first few weeks: Ibuprofen (Motrin/Advil) - 200mg  tabs - take 3 tabs (600mg ) every 6 hours as needed for pain Acetaminophen (Tylenol) - you may take 650mg  every 6 hours as needed. You can take this with motrin as they act differently on the body. If you are taking a narcotic pain medication that has acetaminophen in it, do not take over the counter tylenol at the same time.  Iii. NOTE: You may take both of these medications together - most patients  find it most helpful when alternating between the two (i.e. Ibuprofen at 6am,  tylenol at 9am, ibuprofen at 12pm ..Marland Kitchen) A  prescription for pain medication should be given to you upon discharge.  Take your pain medication as prescribed if your pain is not adequatly controlled with the over-the-counter pain reliefs mentioned above.  Avoid getting constipated.  Between the surgery and the pain medications, it is common to experience some constipation.  Increasing fluid intake and taking a fiber supplement (such as  Metamucil, Citrucel, FiberCon, MiraLax, etc) 1-2 times a day regularly will usually help prevent this problem from occurring.  A mild laxative (prune juice, Milk of Magnesia, MiraLax, etc) should be taken according to package directions if there are no bowel movements after 48 hours.    Dressing: Your incision is covered are Dermabond which is like sterile superglue for the skin. This will come off on it's own in a couple weeks. It is waterproof and you may bathe normally starting the day after your surgery in a shower. Avoid baths/pools/lakes/oceans until your wounds have fully healed.  ACTIVITIES as tolerated:   Avoid heavy lifting (>10lbs or 1 gallon of milk) for the next 6 weeks. You may resume regular (light) daily activities beginning the next day--such as daily self-care, walking, climbing stairs--gradually increasing activities as tolerated.  If you can walk 30 minutes without difficulty, it is safe to try more intense activity such as jogging, treadmill, bicycling, low-impact aerobics.  DO NOT PUSH THROUGH PAIN.  Let pain be your guide: If it hurts to do something, don't do it. You may drive when you are no longer taking prescription pain medication, you can comfortably wear a seatbelt, and you can safely maneuver your car and apply brakes.   FOLLOW UP in our office Please call [facility] at [phone] to set up an appointment to see your surgeon in the office for a follow-up appointment approximately 2 weeks after your surgery. Make sure that you call for this appointment the day you arrive home to  insure a convenient appointment time.  9. If you have disability or family leave forms that need to be completed, you may have them completed by your primary care physician's office; for return to work instructions, please ask our office staff and they will be happy to assist you in obtaining this documentation   When to call us [phone]: Poor pain control Reactions / problems with new  medications (rash/itching, etc)  Fever over 101.5 F (38.5 C) Inability to urinate Nausea/vomiting Worsening swelling or bruising Continued bleeding from incision. Increased pain, redness, or drainage from the incision  The clinic staff is available to answer your questions during regular business hours (8:30am-5pm).  Please don't hesitate to call and ask to speak to one of our nurses for clinical concerns.   A surgeon from [facility] is always on call at the hospitals   If you have a medical emergency, go to the nearest emergency room or call 911.  [facility] [address], [address], [place], [place]  [address] MAIN: [phone] FAX: [phone] [email or web address]  

## 2022-08-06 NOTE — ED Triage Notes (Signed)
Pt here for surgical consult pos appy.  Denies pain at this time after dilaudid given at [facility].  AO x 4.

## 2022-08-09 LAB — SURGICAL PATHOLOGY

## 2022-09-06 ENCOUNTER — Emergency Department (HOSPITAL_BASED_OUTPATIENT_CLINIC_OR_DEPARTMENT_OTHER): Payer: BC Managed Care – PPO

## 2022-09-06 ENCOUNTER — Telehealth: Payer: Self-pay | Admitting: Physician Assistant

## 2022-09-06 ENCOUNTER — Emergency Department (HOSPITAL_BASED_OUTPATIENT_CLINIC_OR_DEPARTMENT_OTHER)
Admission: EM | Admit: 2022-09-06 | Discharge: 2022-09-06 | Disposition: A | Discharge status: Home / Self Care | Payer: BC Managed Care – PPO | Attending: Emergency Medicine | Admitting: Emergency Medicine

## 2022-09-06 ENCOUNTER — Encounter (HOSPITAL_BASED_OUTPATIENT_CLINIC_OR_DEPARTMENT_OTHER): Payer: Self-pay

## 2022-09-06 DIAGNOSIS — Z9049 Acquired absence of other specified parts of digestive tract: Secondary | ICD-10-CM | POA: Insufficient documentation

## 2022-09-06 DIAGNOSIS — R1032 Left lower quadrant pain: Secondary | ICD-10-CM | POA: Insufficient documentation

## 2022-09-06 DIAGNOSIS — R109 Unspecified abdominal pain: Secondary | ICD-10-CM

## 2022-09-06 LAB — COMPREHENSIVE METABOLIC PANEL
ALT: 16 U/L (ref 0–44)
AST: 15 U/L (ref 15–41)
Albumin: 4.8 g/dL (ref 3.5–5.0)
Alkaline Phosphatase: 73 U/L (ref 38–126)
Anion gap: 8 (ref 5–15)
BUN: 8 mg/dL (ref 6–20)
CO2: 24 mmol/L (ref 22–32)
Calcium: 10.2 mg/dL (ref 8.9–10.3)
Chloride: 105 mmol/L (ref 98–111)
Creatinine, Ser: 0.76 mg/dL (ref 0.44–1.00)
GFR, Estimated: >60 mL/min (ref >60)
Glucose, Bld: 86 mg/dL (ref 70–99)
Potassium: 3.9 mmol/L (ref 3.5–5.1)
Sodium: 137 mmol/L (ref 135–145)
Total Bilirubin: 0.3 mg/dL (ref 0.3–1.2)
Total Protein: 8.4 g/dL — ABNORMAL HIGH (ref 6.5–8.1)

## 2022-09-06 LAB — URINALYSIS, ROUTINE W REFLEX MICROSCOPIC
Bilirubin Urine: NEGATIVE
Glucose, UA: NEGATIVE mg/dL
Hgb urine dipstick: NEGATIVE
Ketones, ur: NEGATIVE mg/dL
Leukocytes,Ua: NEGATIVE
Nitrite: NEGATIVE
Protein, ur: NEGATIVE mg/dL
Specific Gravity, Urine: <1.005 — ABNORMAL LOW (ref 1.005–1.030)
pH: 6.5 (ref 5.0–8.0)

## 2022-09-06 LAB — CBC
HCT: 40.5 % (ref 36.0–46.0)
Hemoglobin: 13.4 g/dL (ref 12.0–15.0)
MCH: 27.7 pg (ref 26.0–34.0)
MCHC: 33.1 g/dL (ref 30.0–36.0)
MCV: 83.9 fL (ref 80.0–100.0)
Platelets: 241 10*3/uL (ref 150–400)
RBC: 4.83 MIL/uL (ref 3.87–5.11)
RDW: 13.5 % (ref 11.5–15.5)
WBC: 8 10*3/uL (ref 4.0–10.5)
nRBC: 0 % (ref 0.0–0.2)

## 2022-09-06 LAB — PREGNANCY, URINE: Preg Test, Ur: NEGATIVE

## 2022-09-06 LAB — LIPASE, BLOOD: Lipase: 29 U/L (ref 11–51)

## 2022-09-06 MED ORDER — IOHEXOL 300 MG/ML  SOLN
80.0000 mL | Freq: Once | INTRAMUSCULAR | Status: AC | PRN
  Administered 2022-09-06: 80 mL via INTRAVENOUS

## 2022-09-06 MED ORDER — KETOROLAC TROMETHAMINE 30 MG/ML IJ SOLN
30.0000 mg | Freq: Once | INTRAMUSCULAR | Status: AC
  Administered 2022-09-06: 30 mg via INTRAVENOUS
  Filled 2022-09-06: qty 1

## 2022-09-06 NOTE — Discharge Instructions (Addendum)
Evaluation of your abdominal pain was overall reassuring.  There is no acute process per CT that could be causing your symptoms.  However I do recommend you follow-up with your PCP especially if your symptoms persist. If you have worsening abdominal pain, new blood in your stool, or blood in the urine please return to the emergency department for further evaluation.

## 2022-09-06 NOTE — ED Provider Notes (Signed)
[facility] Provider Note   CSN: [id number] Arrival date & time: 09/06/22  1340     History  Chief Complaint  Patient presents with   Abdominal Pain   HPI Mary Carlson is a 22 y.o. female s/p appendectomy 3 weeks ago presenting for abdominal pain.  Started yesterday. Located in the left lower quadrant.  Pain feels sharp, dull and achy.  Pain is worse with movement and goes away when sitting still.  Has not take any meds for symptoms.  States she was on oxycodone for a few days after her surgery but has not taken any since.  Denies nausea vomiting diarrhea.  States she has had trouble with regular bowel movements in the last couple of days.  She took 4 stool softeners this morning and had a regular bowel movement.   Abdominal Pain      Home Medications Prior to Admission medications   Medication Sig Start Date End Date Taking? Authorizing Provider  albuterol (VENTOLIN HFA) 108 (90 Base) MCG/ACT inhaler Inhale 2 puffs into the lungs every 6 (six) hours as needed for wheezing or shortness of breath. 11/10/21   Jeanie Sewer, NP  doxycycline (VIBRA-TABS) 100 MG tablet Take 1 tablet (100 mg total) by mouth 2 (two) times daily. 04/07/22   Inda Coke, PA  etonogestrel (NEXPLANON) 68 MG IMPL implant     [provider]  ibuprofen (ADVIL) 600 MG tablet Take 600 mg by mouth every 6 (six) hours as needed for mild pain.    [provider]  methylphenidate 27 MG PO CR tablet Take 27 mg by mouth every morning.    [provider]  Multiple Vitamins-Minerals (MULTI-VITAMIN GUMMIES PO) Take 2 each by mouth daily in the afternoon.    [provider]  ondansetron (ZOFRAN) 4 MG tablet Take 1 tablet (4 mg total) by mouth every 8 (eight) hours as needed for nausea or vomiting. 11/11/21   Jeanie Sewer, NP  sertraline (ZOLOFT) 100 MG tablet Take 100 mg by mouth daily. 03/28/22   [provider]       Allergies    Food, Other, Watermelon [citrullus vulgaris], and Banana    Review of Systems   Review of Systems  Gastrointestinal:  Positive for abdominal pain.    Physical Exam   Vitals:   09/06/22 1543 09/06/22 1600  BP:  134/85  Pulse: 93 87  Resp:  16  Temp:    SpO2: 100% 95%    CONSTITUTIONAL:  well-appearing, NAD NEURO:  Alert and oriented x 3, CN 3-12 grossly intact EYES:  eyes equal and reactive ENT/NECK:  Supple, no stridor  CARDIO:  regular rate and rhythm, appears well-perfused  PULM:  No respiratory distress, CTAB GI/GU:  non-distended, soft, left lower quadrant tenderness with guarding MSK/SPINE:  No gross deformities, no edema, moves all extremities  SKIN:  no rash, atraumatic   *Additional and/or pertinent findings included in MDM below    ED Results / Procedures / Treatments   Labs (all labs ordered are listed, but only abnormal results are displayed) Labs Reviewed  COMPREHENSIVE METABOLIC PANEL - Abnormal; Notable for the following components:      Result Value   Total Protein 8.4 (*)    All other components within normal limits  URINALYSIS, ROUTINE W REFLEX MICROSCOPIC - Abnormal; Notable for the following components:   Color, Urine COLORLESS (*)    Specific Gravity, Urine <1.005 (*)    All other components within normal  limits  LIPASE, BLOOD  CBC  PREGNANCY, URINE    EKG None  Radiology CT Abdomen Pelvis W Contrast  Result Date: 09/06/2022 CLINICAL DATA:  Left lower quadrant abdominal pain. EXAM: CT ABDOMEN AND PELVIS WITH CONTRAST TECHNIQUE: Multidetector CT imaging of the abdomen and pelvis was performed using the standard protocol following bolus administration of intravenous contrast. RADIATION DOSE REDUCTION: This exam was performed according to the departmental dose-optimization program which includes automated exposure control, adjustment of the mA and/or kV according to patient size and/or use of iterative reconstruction technique.  CONTRAST:  49m OMNIPAQUE IOHEXOL 300 MG/ML  SOLN COMPARISON:  CT examination dated August 06, 2022 FINDINGS: Lower chest: No acute abnormality. Hepatobiliary: No focal liver abnormality is seen. No gallstones, gallbladder wall thickening, or biliary dilatation. Pancreas: Unremarkable. No pancreatic ductal dilatation or surrounding inflammatory changes. Spleen: Normal in size without focal abnormality. Adrenals/Urinary Tract: Adrenal glands are unremarkable. Kidneys are normal, without renal calculi, focal lesion, or hydronephrosis. Bladder is unremarkable. Stomach/Bowel: Stomach is within normal limits. Surgical material at the cecal base suggesting appendectomy. No evidence of bowel wall thickening, distention, or inflammatory changes. Vascular/Lymphatic: Circumaortic left renal vein. Reproductive: Uterus and bilateral adnexa are unremarkable. Other: Postsurgical changes along the umbilicus, likely sequela of recent surgery. No abdominal wall hematoma. No abdominal/pelvic ascites Musculoskeletal: No acute or significant osseous findings. IMPRESSION: 1. No acute abdominopelvic abnormality. 2. Postsurgical changes along the umbilicus, likely secondary to recent appendectomy. No abdominal wall hematoma. Electronically Signed   By: IKeane PoliceD.O.   On: 09/06/2022 15:56    Procedures Procedures    Medications Ordered in ED Medications  ketorolac (TORADOL) 30 MG/ML injection 30 mg (30 mg Intravenous Given 09/06/22 1519)  iohexol (OMNIPAQUE) 300 MG/ML solution 80 mL (80 mLs Intravenous Contrast Given 09/06/22 1531)    ED Course/ Medical Decision Making/ A&P                             Medical Decision Making Amount and/or Complexity of Data Reviewed Labs: ordered. Radiology: ordered.  Risk Prescription drug management.   Initial Impression and Ddx 22year old female who is well-appearing and hemodynamically stable presenting for abdominal pain.  Exam notable for left lower quadrant pain with  guarding.  DDx includes bowel perforation, bowel obstruction, diverticulitis, ectopic pregnancy, and nephrolithiasis. Patient PMH that increases complexity of ED encounter: Status post recent appendectomy  Interpretation of Diagnostics I independent reviewed and interpreted the labs as followed: No acute derangement  - I independently visualized the following imaging with scope of interpretation limited to determining acute life threatening conditions related to emergency care: CT abdomen pelvis, which revealed post surgical changes but otherwise no acute findings  Patient Reassessment and Ultimate Disposition/Management Treated her pain with Toradol.  After treatment patient states she felt much better.  Primarily concern for possible obstruction and or perforation given that she has had a recent abdominal surgery.  Fortunately CT scan was negative. Advised her to follow-up with her PCP for ongoing abdominal pain.  Vitals remained stable throughout the encounter. Discussed return precautions then discharged home.  Patient management required discussion with the following services or consulting groups:  None  Complexity of Problems Addressed Acute complicated illness or Injury  Additional Data Reviewed and Analyzed Further history obtained from: Past medical history and medications listed in the EMR, Prior ED visit notes, and Recent discharge summary  Patient Encounter Risk Assessment None  Final Clinical Impression(s) / ED Diagnoses Final diagnoses:  Abdominal pain, unspecified abdominal location    Rx / DC Orders ED Discharge Orders     None         Harriet Pho, PA-C 09/06/22 1616    Blanchie Dessert, MD 09/07/22 2157

## 2022-09-06 NOTE — Telephone Encounter (Signed)
FYI, see Triage note. Pt is at the ED.

## 2022-09-06 NOTE — ED Notes (Signed)
Discharge paperwork given and verbally understood. 

## 2022-09-06 NOTE — ED Triage Notes (Signed)
Pt c/o LLQ abd pain onset yesterday, "hurts pretty bad." Denies associated NVD but states she feels like she is about to throw up in triage. Advises last BM today, states she had to take 4 stool softeners to have BM because she had been unable to have BM x2 days.  Emergent appy 3wks ago, denies issues, regular f/u w no concern.

## 2022-09-06 NOTE — Telephone Encounter (Signed)
Patient states: - Had appendectomy on 08/06/22; completed 2 week f/u with [facility] as advised and had no issues @ time - Pain near surgical site as of yesterday; Rated 10 out of 10; Informed surgical office and they informed her to go ED  - She didn't want to go to ED; wanted to see PCP or surgeon instead   Patient has been transferred to triage.

## 2022-09-06 NOTE — ED Notes (Signed)
Pt aware of the need for a urine... Unable to currently.Marland KitchenMarland Kitchen

## 2022-09-06 NOTE — Telephone Encounter (Signed)
Final Disposition is Go to ED Now. Patient currently at ED.  Patient Name: Mary Carlson Gender: Female DOB: 04-03-2001 Age: 22 Y 2 M 26 D Return Phone Number: [phone] (Primary) Address: City/ State/ Zip: [place]  [address] Client [facility] [place] Client Site [facility] Provider Morene Rankins, [place]- PA Contact Type Call Who Is Calling Patient / Member / Family / Caregiver Call Type Triage / Clinical Relationship To Patient Self Return Phone Number [phone] (Primary) Chief Complaint Abdominal Pain Reason for Call Symptomatic / Request for [place] states that she had an appendectomy on 08/06/22, she had her follow up appt 2 weeks later and was doing well. Today she is having pain in the area of her incision that is a 10/10 when she sits or bends. She called [facility] [place] and was instructed to go to the ER for that pain and the caller refused stating that she needs to be seen by the surgeon and now she wants to be seen by her PCP. Translation No Nurse Assessment Nurse: Cherre Robins, RN, Ria Comment Date/Time (Eastern Time): 09/06/2022 12:24:24 PM Confirm and document reason for call. If symptomatic, describe symptoms. ---Caller states that she had an appendectomy on 08/06/22, she had her follow up appt 2 weeks later and was doing well. States she started having pain in the in her abd at the surgery site that started yesterday and is 10/10 pain scale when bending down. States she took four stool softeners and last BM was today. No fever. Does the patient have any new or worsening symptoms? ---Yes Will a triage be completed? ---Yes Related visit to physician within the last 2 weeks? ---No Does the PT have any chronic conditions? (i.e. diabetes, asthma, this includes High risk factors for pregnancy, etc.) ---No Is the patient pregnant or possibly pregnant?  (Ask all females between the ages of 18-55) ---No Is this a behavioral health or substance abuse call? ---No PLEASE NOTE: All timestamps contained within this report are represented as [place] Standard Time. CONFIDENTIALTY NOTICE: This fax transmission is intended only for the addressee. It contains information that is legally privileged, confidential or otherwise protected from use or disclosure. If you are not the intended recipient, you are strictly prohibited from reviewing, disclosing, copying using or disseminating any of this information or taking any action in reliance on or regarding this information. If you have received this fax in error, please notify us immediately by telephone so that we can arrange for its return to [place]. Phone: [phone], Toll-Free: [phone], Fax: [phone] Page: 2 of 2 Call Id: [id number] Guidelines Guideline Title Affirmed Question Affirmed Notes Nurse Date/Time Eilene Ghazi Time) Abdominal Pain - Female [1] SEVERE pain (e.g., excruciating) AND [2] present > 1 hour Weiss-Hilton, RN, Ria Comment 09/06/2022 12:26:28 PM Disp. Time Eilene Ghazi Time) Disposition Final User 09/06/2022 12:27:32 PM Go to ED Now Yes Weiss-Hilton, RN, Ria Comment Final Disposition 09/06/2022 12:27:32 PM Go to ED Now Yes Weiss-Hilton, RN, Ivonne Andrew Disagree/Comply Comply Caller Understands Yes PreDisposition InappropriateToAsk Care Advice Given Per Guideline GO TO ED NOW: * You need to be seen in the Emergency Department. * Go to the ED at ___________ [place] now. Drive carefully. ANOTHER ADULT SHOULD DRIVE: * It is better and safer if another adult drives instead of you. NOTHING BY MOUTH: * Do not eat or drink anything for now. CARE ADVICE given per Abdominal Pain - Female (Adult) guideline. Referrals [facility] -  ED

## 2022-09-07 ENCOUNTER — Telehealth: Payer: Self-pay

## 2022-09-07 NOTE — Transitions of Care (Post Inpatient/ED Visit) (Signed)
   09/07/2022  Name: Mary Carlson MRN: [id number] DOB: [birth year]  Today's TOC FU Call Status: Today's TOC FU Call Status:: Unsuccessul Call (1st Attempt) Unsuccessful Call (1st Attempt) Date: 09/07/22  Attempted to reach the patient regarding the most recent Inpatient/ED visit.  Follow Up Plan: Additional outreach attempts will be made to reach the patient to complete the Transitions of Care (Post Inpatient/ED visit) call.   Signature Reymundo Poll, CMA

## 2022-10-10 ENCOUNTER — Other Ambulatory Visit (HOSPITAL_COMMUNITY): Payer: Self-pay

## 2022-10-10 MED ORDER — AMPHETAMINE-DEXTROAMPHET ER 20 MG PO CP24
20.0000 mg | ORAL_CAPSULE | Freq: Every morning | ORAL | 0 refills | Status: DC
  Filled 2022-10-10: qty 30, 30d supply, fill #0

## 2022-10-10 MED ORDER — AMPHETAMINE-DEXTROAMPHETAMINE 5 MG PO TABS
5.0000 mg | ORAL_TABLET | Freq: Every day | ORAL | 0 refills | Status: DC
  Filled 2022-10-10: qty 30, 30d supply, fill #0

## 2022-10-26 ENCOUNTER — Other Ambulatory Visit: Payer: Self-pay

## 2022-10-26 ENCOUNTER — Other Ambulatory Visit (HOSPITAL_COMMUNITY): Payer: Self-pay

## 2022-10-26 MED ORDER — AMPHETAMINE-DEXTROAMPHETAMINE 10 MG PO TABS
10.0000 mg | ORAL_TABLET | Freq: Every day | ORAL | 0 refills | Status: DC
  Filled 2022-10-26: qty 30, 30d supply, fill #0

## 2022-10-26 MED ORDER — AMPHETAMINE-DEXTROAMPHET ER 20 MG PO CP24
20.0000 mg | ORAL_CAPSULE | Freq: Every morning | ORAL | 0 refills | Status: DC
  Filled 2022-11-23: qty 30, 30d supply, fill #0

## 2022-11-03 ENCOUNTER — Other Ambulatory Visit (HOSPITAL_COMMUNITY): Payer: Self-pay

## 2022-11-23 ENCOUNTER — Other Ambulatory Visit (HOSPITAL_COMMUNITY): Payer: Self-pay

## 2022-11-23 MED ORDER — AMPHETAMINE-DEXTROAMPHETAMINE 10 MG PO TABS: 10.0000 mg | ORAL_TABLET | Freq: Every day | ORAL | 0 refills | Status: DC

## 2022-12-21 ENCOUNTER — Other Ambulatory Visit (HOSPITAL_COMMUNITY): Payer: Self-pay

## 2022-12-21 MED ORDER — AMPHETAMINE-DEXTROAMPHET ER 30 MG PO CP24
30.0000 mg | ORAL_CAPSULE | Freq: Every morning | ORAL | 0 refills | Status: DC
  Filled 2022-12-21: qty 20, 20d supply, fill #0
  Filled 2022-12-21: qty 10, 10d supply, fill #0

## 2022-12-21 MED ORDER — AMPHETAMINE-DEXTROAMPHETAMINE 5 MG PO TABS
5.0000 mg | ORAL_TABLET | ORAL | 0 refills | Status: DC
  Filled 2022-12-21: qty 30, 30d supply, fill #0

## 2023-01-18 ENCOUNTER — Other Ambulatory Visit (HOSPITAL_COMMUNITY): Payer: Self-pay

## 2023-01-18 MED ORDER — AMPHETAMINE-DEXTROAMPHETAMINE 10 MG PO TABS
10.0000 mg | ORAL_TABLET | Freq: Every day | ORAL | 0 refills | Status: DC
  Filled 2023-01-18: qty 60, 60d supply, fill #0

## 2023-01-19 ENCOUNTER — Other Ambulatory Visit (HOSPITAL_COMMUNITY): Payer: Self-pay

## 2023-02-08 ENCOUNTER — Other Ambulatory Visit (HOSPITAL_COMMUNITY): Payer: Self-pay

## 2023-02-08 MED ORDER — AMPHETAMINE-DEXTROAMPHETAMINE 10 MG PO TABS
10.0000 mg | ORAL_TABLET | Freq: Every day | ORAL | 0 refills | Status: DC
  Filled 2023-02-08: qty 75, 50d supply, fill #0
  Filled 2023-02-09: qty 75, 30d supply, fill #0

## 2023-02-09 ENCOUNTER — Other Ambulatory Visit (HOSPITAL_COMMUNITY): Payer: Self-pay

## 2023-02-16 ENCOUNTER — Emergency Department (HOSPITAL_BASED_OUTPATIENT_CLINIC_OR_DEPARTMENT_OTHER): Payer: BC Managed Care – PPO

## 2023-02-16 ENCOUNTER — Emergency Department (HOSPITAL_BASED_OUTPATIENT_CLINIC_OR_DEPARTMENT_OTHER)
Admission: EM | Admit: 2023-02-16 | Discharge: 2023-02-16 | Disposition: A | Discharge status: Home / Self Care | Payer: BC Managed Care – PPO | Attending: Emergency Medicine | Admitting: Emergency Medicine

## 2023-02-16 ENCOUNTER — Encounter (HOSPITAL_BASED_OUTPATIENT_CLINIC_OR_DEPARTMENT_OTHER): Payer: Self-pay

## 2023-02-16 ENCOUNTER — Other Ambulatory Visit: Payer: Self-pay

## 2023-02-16 DIAGNOSIS — R1084 Generalized abdominal pain: Secondary | ICD-10-CM | POA: Diagnosis present

## 2023-02-16 DIAGNOSIS — K5900 Constipation, unspecified: Secondary | ICD-10-CM | POA: Diagnosis not present

## 2023-02-16 LAB — CBC WITH DIFFERENTIAL/PLATELET
Abs Immature Granulocytes: 0.01 10*3/uL (ref 0.00–0.07)
Basophils Absolute: 0 10*3/uL (ref 0.0–0.1)
Basophils Relative: 1 %
Eosinophils Absolute: 0.1 10*3/uL (ref 0.0–0.5)
Eosinophils Relative: 2 %
HCT: 42.7 % (ref 36.0–46.0)
Hemoglobin: 13.9 g/dL (ref 12.0–15.0)
Immature Granulocytes: 0 %
Lymphocytes Relative: 32 %
Lymphs Abs: 1.9 10*3/uL (ref 0.7–4.0)
MCH: 27.8 pg (ref 26.0–34.0)
MCHC: 32.6 g/dL (ref 30.0–36.0)
MCV: 85.4 fL (ref 80.0–100.0)
Monocytes Absolute: 0.3 10*3/uL (ref 0.1–1.0)
Monocytes Relative: 4 %
Neutro Abs: 3.6 10*3/uL (ref 1.7–7.7)
Neutrophils Relative %: 61 %
Platelets: 268 10*3/uL (ref 150–400)
RBC: 5 MIL/uL (ref 3.87–5.11)
RDW: 13.6 % (ref 11.5–15.5)
WBC: 6 10*3/uL (ref 4.0–10.5)
nRBC: 0 % (ref 0.0–0.2)

## 2023-02-16 LAB — COMPREHENSIVE METABOLIC PANEL
ALT: 19 U/L (ref 0–44)
AST: 17 U/L (ref 15–41)
Albumin: 4.8 g/dL (ref 3.5–5.0)
Alkaline Phosphatase: 68 U/L (ref 38–126)
Anion gap: 7 (ref 5–15)
BUN: 9 mg/dL (ref 6–20)
CO2: 27 mmol/L (ref 22–32)
Calcium: 9.9 mg/dL (ref 8.9–10.3)
Chloride: 105 mmol/L (ref 98–111)
Creatinine, Ser: 1 mg/dL (ref 0.44–1.00)
GFR, Estimated: >60 mL/min (ref >60)
Glucose, Bld: 90 mg/dL (ref 70–99)
Potassium: 4 mmol/L (ref 3.5–5.1)
Sodium: 139 mmol/L (ref 135–145)
Total Bilirubin: 0.5 mg/dL (ref 0.3–1.2)
Total Protein: 8.1 g/dL (ref 6.5–8.1)

## 2023-02-16 LAB — URINALYSIS, ROUTINE W REFLEX MICROSCOPIC
Bacteria, UA: NONE SEEN
Bilirubin Urine: NEGATIVE
Glucose, UA: NEGATIVE mg/dL
Hgb urine dipstick: NEGATIVE
Ketones, ur: NEGATIVE mg/dL
Nitrite: NEGATIVE
Protein, ur: NEGATIVE mg/dL
Specific Gravity, Urine: 1.008 (ref 1.005–1.030)
WBC, UA: >50 WBC/hpf (ref 0–5)
pH: 7 (ref 5.0–8.0)

## 2023-02-16 LAB — LIPASE, BLOOD: Lipase: 24 U/L (ref 11–51)

## 2023-02-16 LAB — PREGNANCY, URINE: Preg Test, Ur: NEGATIVE

## 2023-02-16 MED ORDER — SODIUM CHLORIDE 0.9 % IV BOLUS
1000.0000 mL | Freq: Once | INTRAVENOUS | Status: AC
  Administered 2023-02-16: 1000 mL via INTRAVENOUS

## 2023-02-16 MED ORDER — IOHEXOL 300 MG/ML  SOLN
100.0000 mL | Freq: Once | INTRAMUSCULAR | Status: AC | PRN
  Administered 2023-02-16: 85 mL via INTRAVENOUS

## 2023-02-16 NOTE — Discharge Instructions (Addendum)
Put 8 capfuls of miralax in a 32 oz gatorade and drink over 1-2 hours. In addition to this continue taking dulcolax once daily. Increase fiber and water intake. If you are still having trouble passing stools you may need an enema. You should follow up with your primary care doctor and schedule an appointment with the GI doctor on your paperwork.

## 2023-02-16 NOTE — ED Notes (Signed)
Reviewed AVS/discharge instruction with patient. Time allotted for and all questions answered. Patient is agreeable for d/c and escorted to ed exit by staff.  

## 2023-02-16 NOTE — ED Triage Notes (Signed)
Pt states "I think I might have a hernia- got my appendix out in Feb, waited 1.5-10mos to lift heavy, but last night I felt like a baby was poking out of my stomach." States she is unsure when symptoms started, "but I went like 5 days without pooping & then at a lot & took a laxative but didn't feel it moving." States she was finally able to have a bowel movement 2 days ago, states that she started to feel "stomach poking through 3-4 days ago."

## 2023-02-16 NOTE — ED Provider Notes (Signed)
[place] EMERGENCY DEPARTMENT AT [facility] Provider Note   CSN: [id number] Arrival date & time: 02/16/23  1040     History  Chief Complaint  Patient presents with   Hernia    Mary Carlson is a 22 y.o. female.  Mary Carlson is a 22 y.o. female with a history of anxiety, depression and ADHD, who presents to the ED for evaluation of abdominal pain and concern for possible hernia.  Patient reports that for the last 1 to 2 weeks she has had worsening constipation and when at least 5 days without having a bowel movement.  She tried taking a laxative but did not feel like she was able to pass much stool.  She was finally able to have a small bowel movement 2 days ago but she has felt like her stomach is poking out and more bloated.  Patient reports back in February she had appendicitis and had surgery to have her appendix removed.  Follow the appropriate guidelines and waited 1-1/2 to 2 months to start doing any heavy lifting but felt like something was poking out of her stomach and talked to a coworker who told her that she could have a hernia.  Patient reports she still been able to pass gas.  No difficulty eating or drinking, denies any nausea or vomiting.  No other abdominal surgeries.  No other aggravating or alleviating factors.  The history is provided by the patient.       Home Medications Prior to Admission medications   Medication Sig Start Date End Date Taking? Authorizing Provider  albuterol (VENTOLIN HFA) 108 (90 Base) MCG/ACT inhaler Inhale 2 puffs into the lungs every 6 (six) hours as needed for wheezing or shortness of breath. 11/10/21   Dulce Sellar, NP  amphetamine-dextroamphetamine (ADDERALL XR) 20 MG 24 hr capsule Take 1 capsule (20 mg total) by mouth every morning. 10/10/22     amphetamine-dextroamphetamine (ADDERALL XR) 20 MG 24 hr capsule Take 1 capsule (20 mg total) by mouth every morning 11/08/22     amphetamine-dextroamphetamine (ADDERALL XR) 30 MG  24 hr capsule Take 1 capsule (30 mg total) by mouth every morning. 12/21/22     amphetamine-dextroamphetamine (ADDERALL) 10 MG tablet Take 1 tablet (10 mg total) by mouth daily mid-day 10/25/22     amphetamine-dextroamphetamine (ADDERALL) 10 MG tablet Take 1 tablet (10 mg total) by mouth daily mid-day (fill 11/25/22) 11/25/22     amphetamine-dextroamphetamine (ADDERALL) 10 MG tablet Take 1 tablet (10 mg total) by mouth once mid-day. 01/18/23     amphetamine-dextroamphetamine (ADDERALL) 10 MG tablet Take 1 -1 and 1/2 tablets (10 - 15 mg total) by mouth every morning and 1 tablet (10mg ) mid day 02/08/23     amphetamine-dextroamphetamine (ADDERALL) 5 MG tablet Take 1 tablet (5 mg total) by mouth mid-day. 12/21/22     doxycycline (VIBRA-TABS) 100 MG tablet Take 1 tablet (100 mg total) by mouth 2 (two) times daily. 04/07/22   Jarold Motto, PA  etonogestrel (NEXPLANON) 68 MG IMPL implant     [provider]  ibuprofen (ADVIL) 600 MG tablet Take 600 mg by mouth every 6 (six) hours as needed for mild pain.    [provider]  methylphenidate 27 MG PO CR tablet Take 27 mg by mouth every morning.    [provider]  Multiple Vitamins-Minerals (MULTI-VITAMIN GUMMIES PO) Take 2 each by mouth daily in the afternoon.    [provider]  ondansetron (ZOFRAN) 4 MG tablet Take 1 tablet (4  mg total) by mouth every 8 (eight) hours as needed for nausea or vomiting. 11/11/21   Dulce Sellar, NP  sertraline (ZOLOFT) 100 MG tablet Take 100 mg by mouth daily. 03/28/22   [provider]      Allergies    Food, Other, Watermelon [citrullus vulgaris], and Banana    Review of Systems   Review of Systems  Constitutional:  Negative for chills and fever.  HENT: Negative.    Respiratory:  Negative for cough and shortness of breath.   Cardiovascular:  Negative for chest pain.  Gastrointestinal:  Positive for abdominal distention, abdominal pain and constipation. Negative for nausea  and vomiting.  Genitourinary:  Negative for dysuria, frequency, vaginal bleeding and vaginal discharge.    Physical Exam Updated Vital Signs BP 114/65   Pulse 73   Temp 98.4 F (36.9 C)   Resp 16   SpO2 99%  Physical Exam Vitals and nursing note reviewed.  Constitutional:      General: She is not in acute distress.    Appearance: Normal appearance. She is well-developed. She is not ill-appearing or diaphoretic.  HENT:     Head: Normocephalic and atraumatic.  Eyes:     General:        Right eye: No discharge.        Left eye: No discharge.     Pupils: Pupils are equal, round, and reactive to light.  Cardiovascular:     Rate and Rhythm: Normal rate and regular rhythm.     Pulses: Normal pulses.     Heart sounds: Normal heart sounds.  Pulmonary:     Effort: Pulmonary effort is normal. No respiratory distress.     Breath sounds: Normal breath sounds. No wheezing or rales.     Comments: Respirations equal and unlabored, patient able to speak in full sentences, lungs clear to auscultation bilaterally  Abdominal:     General: Bowel sounds are normal. There is no distension.     Palpations: Abdomen is soft. There is no mass.     Tenderness: There is abdominal tenderness. There is no guarding.     Comments: Abdomen soft, nondistended, mild generalized tenderness noted without focal tenderness in 1 quadrant, no guarding or rebound tenderness.  No hernias noted on exam.  Well-healed small trocar scars from prior appendectomy.  Lower abdomen and pelvic region examined with patient bearing down and no palpable or visualized hernias noted  Musculoskeletal:        General: No deformity.     Cervical back: Neck supple.  Skin:    General: Skin is warm and dry.     Capillary Refill: Capillary refill takes less than 2 seconds.  Neurological:     Mental Status: She is alert and oriented to person, place, and time.     Coordination: Coordination normal.     Comments: Speech is clear, able to  follow commands CN III-XII intact Normal strength in upper and lower extremities bilaterally including dorsiflexion and plantar flexion, strong and equal grip strength Sensation normal to light and sharp touch Moves extremities without ataxia, coordination intact  Psychiatric:        Mood and Affect: Mood normal.        Behavior: Behavior normal.     ED Results / Procedures / Treatments   Labs (all labs ordered are listed, but only abnormal results are displayed) Labs Reviewed  URINALYSIS, ROUTINE W REFLEX MICROSCOPIC - Abnormal; Notable for the following components:  Result Value   Color, Urine COLORLESS (*)    Leukocytes,Ua LARGE (*)    All other components within normal limits  COMPREHENSIVE METABOLIC PANEL  LIPASE, BLOOD  CBC WITH DIFFERENTIAL/PLATELET  PREGNANCY, URINE    EKG None  Radiology CT ABDOMEN PELVIS W CONTRAST  Result Date: 02/16/2023 CLINICAL DATA:  LLQ abdominal pain EXAM: CT ABDOMEN AND PELVIS WITH CONTRAST TECHNIQUE: Multidetector CT imaging of the abdomen and pelvis was performed using the standard protocol following bolus administration of intravenous contrast. RADIATION DOSE REDUCTION: This exam was performed according to the departmental dose-optimization program which includes automated exposure control, adjustment of the mA and/or kV according to patient size and/or use of iterative reconstruction technique. CONTRAST:  85mL OMNIPAQUE IOHEXOL 300 MG/ML  SOLN COMPARISON:  None Available. FINDINGS: Lower chest: Lung bases are clear. Hepatobiliary: Liver has a normal contour. No focal liver lesions are visualized. No perihepatic fluid. Portal and hepatic veins are contrast opacify. No evidence of cholelithiasis or cholecystitis. Pancreas: Unremarkable. No pancreatic ductal dilatation or surrounding inflammatory changes. Spleen: Normal in size without focal abnormality. Adrenals/Urinary Tract: Bilateral adrenal glands are normal in appearance. Bilateral kidneys  are without evidence of hydronephrosis or nephrolithiasis. There is no focal renal lesion. The urinary bladder is unremarkable. Stomach/Bowel: No evidence of bowel obstruction. Status post appendectomy. No focal wall thickening. No evidence of a ventral hernia. Vascular/Lymphatic: No significant vascular findings are present. No enlarged abdominal or pelvic lymph nodes. Reproductive: Uterus and bilateral adnexa are unremarkable. Other: No abdominal wall hernia or abnormality. No abdominopelvic ascites. Musculoskeletal: No acute or significant osseous findings. IMPRESSION: No acute abdominopelvic findings. No definite finding to suggest an abdominal wall hernia. Electronically Signed   By: Lorenza Cambridge M.D.   On: 02/16/2023 15:10     Procedures Procedures    Medications Ordered in ED Medications - No data to display  ED Course/ Medical Decision Making/ A&P                                 Medical Decision Making Amount and/or Complexity of Data Reviewed Labs: ordered. Radiology: ordered.  Risk Prescription drug management.   Patient presents to the ED with complaints of abdominal pain. Patient nontoxic appearing, in no apparent distress, vitals WNL. On exam patient with mild generalized tenderness, no peritoneal signs. Will evaluate with labs and CT abdomen pelvis.   Ddx including but not limited to: Constipation, bowel obstruction, hernia, diverticulitis  Additional history obtained:  Additional history obtained from chart review & nursing note review.   Lab Tests:  I Ordered, viewed, and interpreted labs, which included:  CBC: No leukocytosis and normal hemoglobin CMP: No electrolyte derangements, normal renal and liver function Lipase: WNL UA: Leukocytes present but with no bacteria or other signs of infection Preg test: Negative  Imaging Studies ordered:  I ordered imaging studies which included CT abdomen pelvis, I independently reviewed, formal radiology impression shows:   Significant amount of stool noted throughout the colon more noted in the ascending colon, no other acute abnormalities noted in the abdomen or pelvis  ED Course:  On repeat abdominal exam patient remains without peritoneal signs, low suspicion for cholecystitis, pancreatitis, diverticulitis, appendicitis, bowel obstruction/perforation,  PID, ectopic pregnancy, or other acute surgical process.  Discussed with patient that I suspect the majority of her symptoms are due to constipation.  Recommend 8 capfuls of MiraLAX in a 32 ounce Gatorade to help get bowels moving  encouraged increased fiber intake and continuing to drink plenty of water.  Once bowel movements occur encourage patient continue MiraLAX daily.  Patient tolerating PO in the emergency department. Will discharge home with supportive measures. I discussed results, treatment plan, need for GI follow-up, and return precautions with the patient. Provided opportunity for questions, patient confirmed understanding and is in agreement with plan.   Portions of this note were generated with Scientist, clinical (histocompatibility and immunogenetics). Dictation errors may occur despite best attempts at proofreading.           Final Clinical Impression(s) / ED Diagnoses Final diagnoses:  Generalized abdominal pain  Constipation, unspecified constipation type    Rx / DC Orders ED Discharge Orders     None         Legrand Rams 03/01/23 1659    Cathren Laine, MD 03/01/23 [phone]

## 2023-02-16 NOTE — ED Notes (Signed)
Pt currently unable to give urine sample.

## 2023-03-28 ENCOUNTER — Other Ambulatory Visit (HOSPITAL_COMMUNITY)
Admission: RE | Admit: 2023-03-28 | Discharge: 2023-03-28 | Disposition: A | Discharge status: Home / Self Care | Payer: BC Managed Care – PPO | Source: Ambulatory Visit | Attending: Physician Assistant | Admitting: Physician Assistant

## 2023-03-28 ENCOUNTER — Ambulatory Visit (INDEPENDENT_AMBULATORY_CARE_PROVIDER_SITE_OTHER): Payer: BC Managed Care – PPO | Admitting: Physician Assistant

## 2023-03-28 ENCOUNTER — Telehealth: Payer: Self-pay | Admitting: Physician Assistant

## 2023-03-28 ENCOUNTER — Encounter: Payer: Self-pay | Admitting: Physician Assistant

## 2023-03-28 VITALS — BP 120/60 | HR 98 | Temp 97.5°F | Ht 65.0 in | Wt 244.0 lb

## 2023-03-28 DIAGNOSIS — R140 Abdominal distension (gaseous): Secondary | ICD-10-CM

## 2023-03-28 DIAGNOSIS — Z113 Encounter for screening for infections with a predominantly sexual mode of transmission: Secondary | ICD-10-CM

## 2023-03-28 DIAGNOSIS — R1013 Epigastric pain: Secondary | ICD-10-CM | POA: Diagnosis not present

## 2023-03-28 LAB — URINALYSIS, ROUTINE W REFLEX MICROSCOPIC
Bilirubin Urine: NEGATIVE
Hgb urine dipstick: NEGATIVE
Ketones, ur: NEGATIVE
Nitrite: NEGATIVE
Specific Gravity, Urine: 1.02 (ref 1.000–1.030)
Total Protein, Urine: NEGATIVE
Urine Glucose: NEGATIVE
Urobilinogen, UA: 0.2 (ref 0.0–1.0)
pH: 6 (ref 5.0–8.0)

## 2023-03-28 LAB — POCT URINE PREGNANCY: Preg Test, Ur: NEGATIVE

## 2023-03-28 MED ORDER — ONDANSETRON HCL 4 MG PO TABS: 4.0000 mg | ORAL_TABLET | Freq: Three times a day (TID) | ORAL | 0 refills | Status: DC | PRN

## 2023-03-28 NOTE — Patient Instructions (Signed)
It was great to see you!  Take OTC (available over the counter without a prescription) medication such as Prilosec (or generic alternative) to calm down stomach acid  To treat your constipation today: -Drink at least 64 oz of water daily -Take 2 capfuls of polyethylene glycol (also known as Miralax, however generic is fine!) to beverage of choice daily  If still no results, please call the office   If your pain persists, let me know and we will get an abdominal ultrasound  We will be in touch with all results and the plan  Take care,  Jarold Motto PA-C

## 2023-03-28 NOTE — Telephone Encounter (Signed)
Patient returned call. Requests to be called. 

## 2023-03-28 NOTE — Progress Notes (Signed)
Mary Carlson is a 22 y.o. female here for a follow up of a pre-existing problem.  History of Present Illness:   Chief Complaint  Patient presents with   Abdominal Pain    Pt c/o sharp upper abdominal pain x 1 week, white/yellow discharge from umbilicus x 5 days.    Exposure to STD    Pt would like STD testing done, had sex 4 weeks ago,not sure if discharge.    HPI  Abdominal Pain: Complains of upper abdominal pain that began 1 week ago Sharp pain with slight nausea Denies vomiting, fever, chills Started soon after eating a meal Having bloating She admits to a lot of anxiety and constipation recently Went to emergency room on 02/16/23 for abdominal pain and was told to work on better bowel regimen  STD Testing: Requests testing  Last sexually active about 1 month ago No significant vaginal discharge, fevers, chills    Past Medical History:  Diagnosis Date   ADHD (attention deficit hyperactivity disorder)    Anxiety    COVID-19    2023   Depression    History of chicken pox      Social History   Tobacco Use   Smoking status: Former    Types: E-cigarettes    Quit date: 10/13/2020    Years since quitting: 2.4   Smokeless tobacco: Never  Vaping Use   Vaping status: Every Day  Substance Use Topics   Alcohol use: Yes    Alcohol/week: 15.0 standard drinks of alcohol    Types: 15 Shots of liquor per week    Comment: 3 x's a week   Drug use: Never    Past Surgical History:  Procedure Laterality Date   LAPAROSCOPIC APPENDECTOMY N/A 08/06/2022   Procedure: APPENDECTOMY LAPAROSCOPIC;  Surgeon: Andria Meuse, MD;  Location: [facility] OR;  Service: General;  Laterality: N/A;   TONSILLECTOMY N/A 03/02/2019    Family History  Problem Relation Age of Onset   Cancer Maternal Grandmother    Hypertension Paternal Grandmother    Hyperlipidemia Paternal Grandmother    Diabetes Paternal Grandmother    Hypertension Paternal Grandfather    Hyperlipidemia Paternal  Grandfather     Allergies  Allergen Reactions   Food Itching    Walnuts    Other Anaphylaxis and Other (See Comments)    Walnuts & other tree nuts   Watermelon [Citrullus Vulgaris] Other (See Comments)    Itchy throat   Banana Rash    Current Medications:   Current Outpatient Medications:    albuterol (VENTOLIN HFA) 108 (90 Base) MCG/ACT inhaler, Inhale 2 puffs into the lungs every 6 (six) hours as needed for wheezing or shortness of breath., Disp: 8 g, Rfl: 0   amphetamine-dextroamphetamine (ADDERALL) 20 MG tablet, Take 10 mg by mouth 2 (two) times daily., Disp: , Rfl:    etonogestrel (NEXPLANON) 68 MG IMPL implant, , Disp: , Rfl:    Multiple Vitamins-Minerals (MULTI-VITAMIN GUMMIES PO), Take 2 each by mouth daily in the afternoon., Disp: , Rfl:    ondansetron (ZOFRAN) 4 MG tablet, Take 1 tablet (4 mg total) by mouth every 8 (eight) hours as needed for nausea or vomiting., Disp: 20 tablet, Rfl: 0   ibuprofen (ADVIL) 600 MG tablet, Take 600 mg by mouth every 6 (six) hours as needed for mild pain. (Patient not taking: Reported on 03/28/2023), Disp: , Rfl:    Review of Systems:   ROS See pertinent positives and negatives as per the HPI.  Vitals:  Vitals:   03/28/23 1150  BP: 120/60  Pulse: 98  Temp: (!) 97.5 F (36.4 C)  TempSrc: Temporal  SpO2: 96%  Weight: 244 lb (110.7 kg)  Height: 5\' 5"  (1.651 m)     Body mass index is 40.6 kg/m.  Physical Exam:   Physical Exam Vitals and nursing note reviewed. Exam conducted with a chaperone present.  Constitutional:      General: She is not in acute distress.    Appearance: She is well-developed. She is not ill-appearing or toxic-appearing.  Cardiovascular:     Rate and Rhythm: Normal rate and regular rhythm.     Pulses: Normal pulses.     Heart sounds: Normal heart sounds, S1 normal and S2 normal.  Pulmonary:     Effort: Pulmonary effort is normal.     Breath sounds: Normal breath sounds.  Abdominal:     General:  Abdomen is flat. Bowel sounds are normal.     Palpations: Abdomen is soft.     Tenderness: There is no abdominal tenderness. There is no right CVA tenderness or left CVA tenderness.     Comments: No obvious drainage of umbilicus  Genitourinary:    Vagina: Normal.     Cervix: Normal.     Uterus: Normal.      Comments: She did not allow me to complete bimanual exam - unsure if cmt present Skin:    General: Skin is warm and dry.  Neurological:     Mental Status: She is alert.     GCS: GCS eye subscore is 4. GCS verbal subscore is 5. GCS motor subscore is 6.  Psychiatric:        Speech: Speech normal.        Behavior: Behavior normal. Behavior is cooperative.    Results for orders placed or performed in visit on 03/28/23  POCT urine pregnancy  Result Value Ref Range   Preg Test, Ur Negative Negative    Assessment and Plan:   Screening examination for STD (sexually transmitted disease) No red flags on exam Will add treatment based on results  Abdominal bloating Upt negative No red flags on exam Suspect constipation -- recommend regular intake of miralax 2 capfuls daily Call or message [place] if no improvement, and we will obtain imaging or other appropriate work-up  Epigastric pain No red flags Suspect possible gastritis Recommend daily OTC (available over the counter without a prescription) medication such as omeprazole x 2 weeks to help with this If new/worsening symptom(s), reach out and we can advise on next steps  I,Emily Lagle,acting as a scribe for [employer], PA.,have documented all relevant documentation on the behalf of Jarold Motto, PA,as directed by  Jarold Motto, PA while in the presence of Jarold Motto, [place].  I, Jarold Motto, [place], have reviewed all documentation for this visit. The documentation on 03/28/23 for the exam, diagnosis, procedures, and orders are all accurate and complete.  Jarold Motto, PA-C

## 2023-03-28 NOTE — Telephone Encounter (Signed)
Spoke to pt told her Samantha sent in the nausea medication. Pt verbalized understanding.

## 2023-03-29 LAB — CERVICOVAGINAL ANCILLARY ONLY
Bacterial Vaginitis (gardnerella): NEGATIVE
Candida Glabrata: NEGATIVE
Candida Vaginitis: NEGATIVE
Chlamydia: NEGATIVE
Comment: NEGATIVE
Comment: NEGATIVE
Comment: NEGATIVE
Comment: NEGATIVE
Comment: NEGATIVE
Comment: NORMAL
Neisseria Gonorrhea: NEGATIVE
Trichomonas: NEGATIVE

## 2023-03-30 ENCOUNTER — Other Ambulatory Visit: Payer: Self-pay | Admitting: Physician Assistant

## 2023-03-30 LAB — URINE CULTURE
MICRO NUMBER:: 15510066
SPECIMEN QUALITY:: ADEQUATE

## 2023-03-30 LAB — HIV ANTIBODY (ROUTINE TESTING W REFLEX): HIV 1&2 Ab, 4th Generation: NONREACTIVE

## 2023-03-30 LAB — RPR: RPR Ser Ql: NONREACTIVE

## 2023-03-30 MED ORDER — CEPHALEXIN 500 MG PO CAPS: 500.0000 mg | ORAL_CAPSULE | Freq: Two times a day (BID) | ORAL | 0 refills | Status: DC

## 2023-04-03 ENCOUNTER — Encounter: Payer: Self-pay | Admitting: Physician Assistant

## 2023-04-05 ENCOUNTER — Other Ambulatory Visit (HOSPITAL_COMMUNITY): Payer: Self-pay

## 2023-04-05 MED ORDER — AMPHETAMINE-DEXTROAMPHET ER 25 MG PO CP24
25.0000 mg | ORAL_CAPSULE | Freq: Every morning | ORAL | 0 refills | Status: DC
  Filled 2023-04-05 – 2023-04-25 (×2): qty 15, 15d supply, fill #0

## 2023-04-11 ENCOUNTER — Encounter (HOSPITAL_BASED_OUTPATIENT_CLINIC_OR_DEPARTMENT_OTHER): Payer: Self-pay | Admitting: Emergency Medicine

## 2023-04-11 ENCOUNTER — Emergency Department (HOSPITAL_BASED_OUTPATIENT_CLINIC_OR_DEPARTMENT_OTHER)
Admission: EM | Admit: 2023-04-11 | Discharge: 2023-04-11 | Disposition: A | Discharge status: Home / Self Care | Payer: BC Managed Care – PPO | Attending: Emergency Medicine | Admitting: Emergency Medicine

## 2023-04-11 ENCOUNTER — Emergency Department (HOSPITAL_BASED_OUTPATIENT_CLINIC_OR_DEPARTMENT_OTHER): Payer: BC Managed Care – PPO | Admitting: Radiology

## 2023-04-11 ENCOUNTER — Other Ambulatory Visit: Payer: Self-pay

## 2023-04-11 DIAGNOSIS — W1839XA Other fall on same level, initial encounter: Secondary | ICD-10-CM | POA: Insufficient documentation

## 2023-04-11 DIAGNOSIS — S63501A Unspecified sprain of right wrist, initial encounter: Secondary | ICD-10-CM | POA: Insufficient documentation

## 2023-04-11 DIAGNOSIS — S6991XA Unspecified injury of right wrist, hand and finger(s), initial encounter: Secondary | ICD-10-CM | POA: Diagnosis present

## 2023-04-11 NOTE — ED Provider Notes (Signed)
[place] [facility] AT [facility] Provider Note   CSN: [id number] Arrival date & time: 04/11/23  1033     History  Chief Complaint  Patient presents with   Wrist Pain    Mary Carlson is a 22 y.o. female.  Patient here with right wrist pain for the last several weeks after a fall.  But that makes it worse or better except for movement makes it worse.  She denies any chest pain shortness of breath.  No weakness numbness tingling.  No pain elsewhere.  The history is provided by the patient.       Home Medications Prior to Admission medications   Medication Sig Start Date End Date Taking? Authorizing Provider  albuterol (VENTOLIN HFA) 108 (90 Base) MCG/ACT inhaler Inhale 2 puffs into the lungs every 6 (six) hours as needed for wheezing or shortness of breath. 11/10/21   Mary Sellar, NP  amphetamine-dextroamphetamine (ADDERALL XR) 25 MG 24 hr capsule Take 1 capsule by mouth every morning. 04/05/23     amphetamine-dextroamphetamine (ADDERALL) 20 MG tablet Take 10 mg by mouth 2 (two) times daily.    [provider]  cephALEXin (KEFLEX) 500 MG capsule Take 1 capsule (500 mg total) by mouth 2 (two) times daily. 03/30/23   Mary Motto, PA  etonogestrel (NEXPLANON) 68 MG IMPL implant     [provider]  Multiple Vitamins-Minerals (MULTI-VITAMIN GUMMIES PO) Take 2 each by mouth daily in the afternoon.    [provider]  ondansetron (ZOFRAN) 4 MG tablet Take 1 tablet (4 mg total) by mouth every 8 (eight) hours as needed for nausea or vomiting. 03/28/23   Mary Motto, PA      Allergies    Food, Other, Watermelon [citrullus vulgaris], and Banana    Review of Systems   Review of Systems  Physical Exam Updated Vital Signs BP 116/69 (BP Location: Left Arm)   Pulse 85   Temp 98.3 F (36.8 C) (Oral)   Resp 18   SpO2 98%  Physical Exam Vitals and nursing note reviewed.  Constitutional:      General: She is not in acute  distress.    Appearance: She is well-developed.  HENT:     Head: Normocephalic and atraumatic.  Cardiovascular:     Pulses: Normal pulses.  Pulmonary:     Effort: Pulmonary effort is normal.  Musculoskeletal:        General: Tenderness present. No swelling. Normal range of motion.     Cervical back: Neck supple.     Comments: Tenderness to the right wrist but there is no tenderness over the snuffbox, no swelling or deformity, pain with range of motion but range of motion is normal  Skin:    General: Skin is warm and dry.     Capillary Refill: Capillary refill takes less than 2 seconds.  Neurological:     Mental Status: She is alert.     Sensory: No sensory deficit.     Motor: No weakness.     ED Results / Procedures / Treatments   Labs (all labs ordered are listed, but only abnormal results are displayed) Labs Reviewed - No data to display  EKG None  Radiology No results found.  Procedures Procedures    Medications Ordered in ED Medications - No data to display  ED Course/ Medical Decision Making/ A&P  Medical Decision Making Amount and/or Complexity of Data Reviewed Radiology: ordered.   Mary Carlson is here with right wrist pain.  Differential diagnosis sprain versus less likely fracture.  Injury occurred a couple weeks ago.  Vital signs are unremarkable.  She is well-appearing.  Neurovascular neuromuscular intact on exam.  X-ray was obtained and per my review and interpretation do not see any evidence of fracture or malalignment.  Will place in a removable wrist splint and have her follow-up with hand.  Discharged in good condition.  Recommend ice, Tylenol, ibuprofen.  This chart was dictated using voice recognition software.  Despite best efforts to proofread,  errors can occur which can change the documentation meaning.         Final Clinical Impression(s) / ED Diagnoses Final diagnoses:  Sprain of right wrist,  initial encounter    Rx / DC Orders ED Discharge Orders     None         Mary Norfolk, DO 04/11/23 1327

## 2023-04-11 NOTE — ED Notes (Signed)
Pt seen, treated, dispositioned and d/c'd by EDP prior to this RN assessment. Pt d/c'd by other. Pt alert, NAD, calm, interactive, splint in place. "Feels better with splint", but rates 10/10.

## 2023-04-11 NOTE — Discharge Instructions (Signed)
Recommend 1000 mg of Tylenol every 6 hours as needed for pain.  Recommend 600 mg ibuprofen every 8 hours as needed for pain.  Follow-up with orthopedics.  Wear splint for comfort.

## 2023-04-11 NOTE — ED Triage Notes (Signed)
R wrist pain. States she injured it while playing volleyball a month ago. Has been wearing a brace and the pain got worse last night. Also reports ongoing constipation which is causing some SOB.

## 2023-04-11 NOTE — ED Notes (Addendum)
Discharge paperwork given and verbally understood. 

## 2023-04-17 ENCOUNTER — Other Ambulatory Visit (HOSPITAL_COMMUNITY): Payer: Self-pay

## 2023-04-25 ENCOUNTER — Other Ambulatory Visit (HOSPITAL_COMMUNITY): Payer: Self-pay

## 2023-05-02 ENCOUNTER — Other Ambulatory Visit (HOSPITAL_COMMUNITY): Payer: Self-pay

## 2023-05-02 MED ORDER — LISDEXAMFETAMINE DIMESYLATE 30 MG PO CAPS
30.0000 mg | ORAL_CAPSULE | Freq: Every morning | ORAL | 0 refills | Status: DC
Start: 2023-05-02 — End: 2023-05-17
  Filled 2023-05-02: qty 15, 15d supply, fill #0

## 2023-05-12 ENCOUNTER — Telehealth: Payer: Self-pay

## 2023-05-12 NOTE — Telephone Encounter (Signed)
Transition Care Management Unsuccessful Follow-up Telephone Call  Date of discharge and from where:  04/11/2023 [facility]  Attempts:  1st Attempt  Reason for unsuccessful TCM follow-up call:  No answer/busy voicemail did not pickup.  Dorothe Elmore [facility]  [facility], [facility] Guide Direct Dial: [phone]  Website: [email or web address]

## 2023-05-15 ENCOUNTER — Telehealth: Payer: Self-pay

## 2023-05-15 NOTE — Telephone Encounter (Signed)
Transition Care Management Unsuccessful Follow-up Telephone Call  Date of discharge and from where:  04/11/2023 [facility]  Attempts:  2nd Attempt  Reason for unsuccessful TCM follow-up call:  Voice mail full  Mary Carlson [facility]  [facility], [facility] Guide Direct Dial: [phone]  Website: [email or web address]

## 2023-05-17 ENCOUNTER — Other Ambulatory Visit (HOSPITAL_COMMUNITY): Payer: Self-pay

## 2023-05-17 MED ORDER — LISDEXAMFETAMINE DIMESYLATE 30 MG PO CAPS
30.0000 mg | ORAL_CAPSULE | Freq: Every morning | ORAL | 0 refills | Status: DC
  Filled 2023-05-17: qty 30, 30d supply, fill #0

## 2023-05-22 ENCOUNTER — Other Ambulatory Visit (HOSPITAL_COMMUNITY): Payer: Self-pay

## 2023-06-14 ENCOUNTER — Other Ambulatory Visit (HOSPITAL_COMMUNITY): Payer: Self-pay

## 2023-06-14 MED ORDER — LISDEXAMFETAMINE DIMESYLATE 30 MG PO CAPS
30.0000 mg | ORAL_CAPSULE | Freq: Every morning | ORAL | 0 refills | Status: DC
  Filled 2023-07-12: qty 30, 30d supply, fill #0

## 2023-06-14 MED ORDER — LISDEXAMFETAMINE DIMESYLATE 30 MG PO CAPS: 30.0000 mg | ORAL_CAPSULE | Freq: Every morning | ORAL | 0 refills | Status: DC

## 2023-07-12 ENCOUNTER — Other Ambulatory Visit (HOSPITAL_COMMUNITY): Payer: Self-pay

## 2023-08-16 ENCOUNTER — Other Ambulatory Visit: Payer: Self-pay

## 2023-08-16 ENCOUNTER — Other Ambulatory Visit (HOSPITAL_COMMUNITY): Payer: Self-pay

## 2023-08-16 MED ORDER — LISDEXAMFETAMINE DIMESYLATE 40 MG PO CAPS
40.0000 mg | ORAL_CAPSULE | Freq: Every day | ORAL | 0 refills | Status: DC
  Filled 2023-08-16: qty 30, 30d supply, fill #0

## 2023-08-30 ENCOUNTER — Other Ambulatory Visit (HOSPITAL_COMMUNITY): Payer: Self-pay

## 2023-08-30 MED ORDER — LISDEXAMFETAMINE DIMESYLATE 30 MG PO CAPS
30.0000 mg | ORAL_CAPSULE | Freq: Every morning | ORAL | 0 refills | Status: DC
  Filled 2023-08-30 – 2023-10-02 (×2): qty 30, 30d supply, fill #0

## 2023-09-11 ENCOUNTER — Other Ambulatory Visit (HOSPITAL_COMMUNITY): Payer: Self-pay

## 2023-10-02 ENCOUNTER — Other Ambulatory Visit (HOSPITAL_COMMUNITY): Payer: Self-pay

## 2023-10-11 ENCOUNTER — Other Ambulatory Visit (HOSPITAL_COMMUNITY): Payer: Self-pay

## 2023-10-11 MED ORDER — PROPRANOLOL HCL 10 MG PO TABS
10.0000 mg | ORAL_TABLET | Freq: Two times a day (BID) | ORAL | 0 refills | Status: AC
  Filled 2023-10-11: qty 30, 15d supply, fill #0

## 2023-10-11 MED ORDER — LISDEXAMFETAMINE DIMESYLATE 30 MG PO CAPS: 30.0000 mg | ORAL_CAPSULE | Freq: Every morning | ORAL | 0 refills | Status: DC

## 2023-10-21 ENCOUNTER — Other Ambulatory Visit (HOSPITAL_COMMUNITY): Payer: Self-pay

## 2023-11-08 ENCOUNTER — Other Ambulatory Visit (HOSPITAL_COMMUNITY): Payer: Self-pay

## 2023-11-08 MED ORDER — LISDEXAMFETAMINE DIMESYLATE 30 MG PO CAPS
30.0000 mg | ORAL_CAPSULE | Freq: Every morning | ORAL | 0 refills | Status: DC
  Filled 2023-11-08: qty 30, 30d supply, fill #0

## 2023-12-06 ENCOUNTER — Other Ambulatory Visit (HOSPITAL_COMMUNITY): Payer: Self-pay

## 2023-12-06 MED ORDER — LISDEXAMFETAMINE DIMESYLATE 30 MG PO CAPS
30.0000 mg | ORAL_CAPSULE | Freq: Every morning | ORAL | 0 refills | Status: DC
  Filled 2024-01-23: qty 30, 30d supply, fill #0

## 2023-12-06 MED ORDER — LISDEXAMFETAMINE DIMESYLATE 30 MG PO CAPS
30.0000 mg | ORAL_CAPSULE | Freq: Every morning | ORAL | 0 refills | Status: DC
  Filled 2023-12-22: qty 30, 30d supply, fill #0

## 2023-12-22 ENCOUNTER — Other Ambulatory Visit (HOSPITAL_COMMUNITY): Payer: Self-pay

## 2024-01-23 ENCOUNTER — Other Ambulatory Visit (HOSPITAL_COMMUNITY): Payer: Self-pay

## 2024-01-24 ENCOUNTER — Other Ambulatory Visit (HOSPITAL_COMMUNITY): Payer: Self-pay

## 2024-02-07 ENCOUNTER — Other Ambulatory Visit (HOSPITAL_COMMUNITY): Payer: Self-pay

## 2024-02-07 MED ORDER — LISDEXAMFETAMINE DIMESYLATE 30 MG PO CAPS
30.0000 mg | ORAL_CAPSULE | Freq: Every morning | ORAL | 0 refills | Status: DC
  Filled 2024-02-27: qty 30, 30d supply, fill #0

## 2024-02-07 MED ORDER — LISDEXAMFETAMINE DIMESYLATE 30 MG PO CAPS
30.0000 mg | ORAL_CAPSULE | Freq: Every morning | ORAL | 0 refills | Status: DC
  Filled 2024-03-30: qty 30, 30d supply, fill #0

## 2024-02-27 ENCOUNTER — Other Ambulatory Visit (HOSPITAL_COMMUNITY): Payer: Self-pay

## 2024-03-30 ENCOUNTER — Other Ambulatory Visit (HOSPITAL_COMMUNITY): Payer: Self-pay

## 2024-04-10 ENCOUNTER — Other Ambulatory Visit (HOSPITAL_COMMUNITY): Payer: Self-pay

## 2024-04-10 MED ORDER — LISDEXAMFETAMINE DIMESYLATE 30 MG PO CAPS
30.0000 mg | ORAL_CAPSULE | Freq: Every morning | ORAL | 0 refills | Status: DC
  Filled 2024-06-03: qty 30, 30d supply, fill #0

## 2024-04-10 MED ORDER — LISDEXAMFETAMINE DIMESYLATE 30 MG PO CAPS
30.0000 mg | ORAL_CAPSULE | Freq: Every morning | ORAL | 0 refills | Status: DC
  Filled 2024-04-30: qty 30, 30d supply, fill #0

## 2024-04-30 ENCOUNTER — Other Ambulatory Visit (HOSPITAL_COMMUNITY): Payer: Self-pay

## 2024-05-09 ENCOUNTER — Ambulatory Visit
Admission: EM | Admit: 2024-05-09 | Discharge: 2024-05-09 | Disposition: A | Discharge status: Home / Self Care | Attending: Family Medicine | Admitting: Family Medicine

## 2024-05-09 DIAGNOSIS — J111 Influenza due to unidentified influenza virus with other respiratory manifestations: Secondary | ICD-10-CM | POA: Diagnosis not present

## 2024-05-09 DIAGNOSIS — R0602 Shortness of breath: Secondary | ICD-10-CM

## 2024-05-09 LAB — POC SOFIA SARS ANTIGEN FIA: SARS Coronavirus 2 Ag: NEGATIVE

## 2024-05-09 LAB — POCT RAPID STREP A (OFFICE): Rapid Strep A Screen: NEGATIVE

## 2024-05-09 LAB — POCT INFLUENZA A/B
Influenza A, POC: NEGATIVE
Influenza B, POC: NEGATIVE

## 2024-05-09 MED ORDER — PREDNISONE 20 MG PO TABS: ORAL_TABLET | ORAL | 0 refills | Status: DC

## 2024-05-09 NOTE — ED Triage Notes (Signed)
 Pt presents to UC for difficulty breathing x2 days. She reports chest pain and burning. Denies cold symptoms or coughing. Hx childhood asthma.

## 2024-05-09 NOTE — Discharge Instructions (Addendum)
 Advised patient all test were negative this evening.  Advised patient take medication as directed with food to completion.  Encouraged to increase daily water intake to 64 ounces per day while taking this medication.  Advised if symptoms worsen and/or unresolved please follow-up your PCP, go to nearest ED, or here for further evaluation.

## 2024-05-09 NOTE — ED Provider Notes (Signed)
 [facility]    CSN: [id number] Arrival date & time: 05/09/24  1615      History   Chief Complaint No chief complaint on file.   HPI Mary Carlson is a 23 y.o. female.   HPI 23 year old female presents with difficulty breathing for 2 days.  Reports chest pain with burning.  PMH significant for severe major depression without psychotic features, ADHD, and anxiety.  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      Home Medications    Prior to Admission medications   Medication Sig Start Date End Date Taking? Authorizing Provider  lisdexamfetamine (VYVANSE ) 30 MG capsule Take 1 capsule (30 mg total) by mouth every morning. 07/14/23  Yes   predniSONE (DELTASONE) 20 MG tablet Take 3 tabs PO daily x 5 days. 05/09/24  Yes Teddy Sharper, FNP  albuterol  (VENTOLIN  HFA) 108 (90 Base) MCG/ACT inhaler Inhale 2 puffs into the lungs every 6 (six) hours as needed for wheezing or shortness of breath. 11/10/21   Lucius Krabbe, NP  amphetamine -dextroamphetamine  (ADDERALL XR) 25 MG 24 hr capsule Take 1 capsule by mouth every morning. 04/05/23     amphetamine -dextroamphetamine  (ADDERALL) 20 MG tablet Take 10 mg by mouth 2 (two) times daily.    [provider]  cephALEXin  (KEFLEX ) 500 MG capsule Take 1 capsule (500 mg  total) by mouth 2 (two) times daily. 03/30/23   Job Lukes, PA  etonogestrel (NEXPLANON) 68 MG IMPL implant     [provider]  lisdexamfetamine (VYVANSE ) 30 MG capsule Take 1 capsule (30 mg total) by mouth every morning. 06/14/23     lisdexamfetamine (VYVANSE ) 30 MG capsule Take 1 capsule (30 mg total) by mouth every morning. 08/30/23     lisdexamfetamine (VYVANSE ) 30 MG capsule Take 1 capsule (30 mg total) by mouth every morning. 10/11/23     lisdexamfetamine (VYVANSE ) 30 MG capsule Take 1 capsule (30 mg total) by mouth in the morning. 11/08/23     lisdexamfetamine (VYVANSE ) 30 MG capsule Take 1 capsule (30 mg total) by mouth in the morning. 12/08/23     lisdexamfetamine (VYVANSE ) 30 MG capsule Take 1 capsule (30 mg total) by mouth in the morning. 01/04/24     lisdexamfetamine (VYVANSE ) 30 MG capsule Take 1 capsule (30 mg total) by mouth every morning. (03/22/24) 03/22/24     lisdexamfetamine (VYVANSE ) 30 MG capsule Take 1 capsule (30 mg total) by mouth every morning. (02/21/24) 02/21/24     lisdexamfetamine (VYVANSE ) 30 MG capsule Take 1 capsule (30 mg total) by mouth in the morning. 04/28/24     lisdexamfetamine (VYVANSE ) 30 MG capsule Take 1 capsule (30 mg total) by mouth in the morning. 05/28/24     lisdexamfetamine (VYVANSE ) 40 MG capsule Take 1 capsule orally every morning 08/16/23  Multiple Vitamins-Minerals (MULTI-VITAMIN GUMMIES PO) Take 2 each by mouth daily in the afternoon.    [provider]  ondansetron  (ZOFRAN ) 4 MG tablet Take 1 tablet (4 mg total) by mouth every 8 (eight) hours as needed for nausea or vomiting. 03/28/23   Job Lukes, PA  propranolol  (INDERAL ) 10 MG tablet Take 1 tablet (10 mg total) by mouth 2 (two) times daily. 10/11/23       Family History Family History  Problem Relation Age of Onset   Cancer Maternal Grandmother    Hypertension Paternal Grandmother    Hyperlipidemia Paternal Grandmother    Diabetes Paternal Grandmother    Hypertension  Paternal Grandfather    Hyperlipidemia Paternal Grandfather     Social History Social History   Tobacco Use   Smoking status: Former    Types: E-cigarettes    Quit date: 10/13/2020    Years since quitting: 3.5   Smokeless tobacco: Never  Vaping Use   Vaping status: Every Day  Substance Use Topics   Alcohol use: Not Currently    Alcohol/week: 15.0 standard drinks of alcohol    Types: 15 Shots of liquor per week   Drug use: Never     Allergies   Food, Other, Watermelon [citrullus vulgaris], and Banana   Review of Systems Review of Systems  Respiratory:  Positive for shortness of breath.   All other systems reviewed and are negative.    Physical Exam Triage Vital Signs ED Triage Vitals  Encounter Vitals Group     BP      Girls Systolic BP Percentile      Girls Diastolic BP Percentile      Boys Systolic BP Percentile      Boys Diastolic BP Percentile      Pulse      Resp      Temp      Temp src      SpO2      Weight      Height      Head Circumference      Peak Flow      Pain Score      Pain Loc      Pain Education      Exclude from Growth Chart    No data found.  Updated Vital Signs BP 116/73 (BP Location: Right Arm)   Pulse 85   Temp 97.8 F (36.6 C) (Oral)   Resp 20   SpO2 97%   Visual Acuity Right Eye Distance:   Left Eye Distance:   Bilateral Distance:    Right Eye Near:   Left Eye Near:    Bilateral Near:     Physical Exam Vitals and nursing note reviewed.  Constitutional:      Appearance: Normal appearance. She is obese.  HENT:     Head: Normocephalic and atraumatic.     Right Ear: Tympanic membrane, ear canal and external ear normal.     Left Ear: Tympanic membrane and ear canal normal.     Mouth/Throat:     Mouth: Mucous membranes are moist.     Pharynx: Oropharynx is clear.  Eyes:     Extraocular Movements: Extraocular movements intact.     Conjunctiva/sclera: Conjunctivae normal.     Pupils: Pupils are equal, round, and  reactive to light.  Cardiovascular:     Rate and Rhythm: Normal rate and regular rhythm.     Pulses: Normal pulses.     Heart sounds: Normal heart sounds.  Pulmonary:  Effort: Pulmonary effort is normal.     Breath sounds: Normal breath sounds. No wheezing, rhonchi or rales.  Musculoskeletal:        General: Normal range of motion.  Skin:    General: Skin is warm and dry.  Neurological:     General: No focal deficit present.     Mental Status: She is alert and oriented to person, place, and time. Mental status is at baseline.  Psychiatric:        Mood and Affect: Mood normal.        Behavior: Behavior normal.      UC Treatments / Results  Labs (all labs ordered are listed, but only abnormal results are displayed) Labs Reviewed  POC SOFIA SARS ANTIGEN FIA  POCT INFLUENZA A/B  POCT RAPID STREP A (OFFICE)    EKG   Radiology No results found.  Procedures Procedures (including critical care time)  Medications Ordered in UC Medications - No data to display  Initial Impression / Assessment and Plan / UC Course  I have reviewed the triage vital signs and the nursing notes.  Pertinent labs & imaging results that were available during my care of the patient were reviewed by me and considered in my medical decision making (see chart for details).     MDM: 1.  Shortness of breath-Rx'd prednisone 20 mg tablet: Take 3 tablets p.o. daily x 5 days; 2.  Influenza-like illness-rapid strep, influenza A/B and COVID-19 all negative this evening, patient advised. Advised patient all test were negative this evening.  Advised patient take medication as directed with food to completion.  Encouraged to increase daily water intake to 64 ounces per day while taking this medication.  Advised if symptoms worsen and/or unresolved please follow-up your PCP, go to nearest ED, or here for further evaluation.  Discharged home, hemodynamically stable. Final Clinical Impressions(s) / UC Diagnoses    Final diagnoses:  Shortness of breath  Influenza-like illness     Discharge Instructions      Advised patient all test were negative this evening.  Advised patient take medication as directed with food to completion.  Encouraged to increase daily water intake to 64 ounces per day while taking this medication.  Advised if symptoms worsen and/or unresolved please follow-up your PCP, go to nearest ED, or here for further evaluation.     ED Prescriptions     Medication Sig Dispense Auth. Provider   predniSONE (DELTASONE) 20 MG tablet Take 3 tabs PO daily x 5 days. 15 tablet Betsie Peckman, FNP      PDMP not reviewed this encounter.   Teddy Sharper, FNP 05/09/24 1714

## 2024-05-21 ENCOUNTER — Ambulatory Visit: Payer: Self-pay

## 2024-05-21 NOTE — Telephone Encounter (Signed)
 Reviewed. Pt scheduled for 11/19.

## 2024-05-21 NOTE — Telephone Encounter (Signed)
 FYI Only or Action Required?: FYI only for provider: appointment scheduled on 05/23/19.  Patient was last seen in primary care on 03/28/2023 by Job Lukes, PA.  Called Nurse Triage reporting Dysuria.  Symptoms began several weeks ago.  Interventions attempted: Nothing.  Symptoms are: gradually worsening.  Triage Disposition: See Physician Within 24 Hours  Patient/caregiver understands and will follow disposition?: Yes Reason for Disposition  All other patients with painful urination  (Exception: [1] EITHER frequency or urgency AND [2] has on-call doctor.)  Answer Assessment - Initial Assessment Questions States had a bladder infection a few months ago and was prescribed doxycycline  and / or amoxicillin  by GYN and was still having symptoms after that and now worsening. Unsure if there is blood in urine.   1. SEVERITY: How bad is the pain?  (e.g., Scale 1-10; mild, moderate, or severe)     11/10  2. PATTERN: Is pain present every time you urinate or just sometimes?      Every time  3. ONSET: When did the painful urination start?      Worsening for the last 3 weeks  4. FEVER: Do you have a fever? If Yes, ask: What is your temperature, how was it measured, and when did it start?     Unsure, but experiencing chills  6. PAST UTI: Have you had a urine infection before? If Yes, ask: When was the last time? and What happened that time?      Few months ago  7. CAUSE: What do you think is causing the painful urination?  (e.g., UTI, scratch, Herpes sore)     UTI  8. OTHER SYMPTOMS: Do you have any other symptoms? (e.g., blood in urine, flank pain, genital sores, urgency, vaginal discharge)     Lower back pain, urinary frequency  Protocols used: Urination Pain - Female-A-AH

## 2024-05-22 ENCOUNTER — Ambulatory Visit (INDEPENDENT_AMBULATORY_CARE_PROVIDER_SITE_OTHER): Admitting: Internal Medicine

## 2024-05-22 ENCOUNTER — Telehealth: Payer: Self-pay | Admitting: Physician Assistant

## 2024-05-22 ENCOUNTER — Other Ambulatory Visit (HOSPITAL_COMMUNITY)
Admission: RE | Admit: 2024-05-22 | Discharge: 2024-05-22 | Disposition: A | Discharge status: Home / Self Care | Source: Ambulatory Visit | Attending: Internal Medicine | Admitting: Internal Medicine

## 2024-05-22 ENCOUNTER — Ambulatory Visit: Admitting: Family Medicine

## 2024-05-22 ENCOUNTER — Encounter: Payer: Self-pay | Admitting: Internal Medicine

## 2024-05-22 VITALS — BP 110/74 | HR 83 | Temp 98.0°F | Ht 65.0 in | Wt 220.0 lb

## 2024-05-22 DIAGNOSIS — R300 Dysuria: Secondary | ICD-10-CM | POA: Diagnosis present

## 2024-05-22 DIAGNOSIS — N342 Other urethritis: Secondary | ICD-10-CM | POA: Diagnosis not present

## 2024-05-22 LAB — POC URINALSYSI DIPSTICK (AUTOMATED)
Bilirubin, UA: NEGATIVE
Blood, UA: NEGATIVE
Glucose, UA: NEGATIVE
Ketones, UA: NEGATIVE
Nitrite, UA: NEGATIVE
Protein, UA: NEGATIVE
Spec Grav, UA: 1.01 (ref 1.010–1.025)
Urobilinogen, UA: 0.2 U/dL
pH, UA: 6.5 (ref 5.0–8.0)

## 2024-05-22 MED ORDER — CEPHALEXIN 500 MG PO CAPS: 500.0000 mg | ORAL_CAPSULE | Freq: Three times a day (TID) | ORAL | 3 refills | Status: DC

## 2024-05-22 NOTE — Telephone Encounter (Signed)
 Pt was seen in office today with Jesus

## 2024-05-22 NOTE — Patient Instructions (Signed)
 It was a pleasure seeing you today! Your health and satisfaction are our top priorities.  Bernardino Cone, MD  VISIT SUMMARY: Today, you were seen for symptoms of a urinary tract infection (UTI), including pelvic pain, urgency, frequency, and burning during urination. 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 We discussed your symptoms, potential causes, and treatment options.  YOUR PLAN: -RECURRENT URINARY TRACT INFECTION WITH DYSURIA: A recurrent urinary tract infection (UTI) means you have had multiple episodes of infection in your urinary tract, causing symptoms like pelvic pain, urgency, and burning during urination. We suspect the infection is caused by Streptococcus agalactiae. A urine culture and cytology have been ordered to confirm the diagnosis and rule out other infections. You have been prescribed Keflex  to treat the suspected infection. We discussed the importance of proper midstream urine collection and advised you to urinate after sexual activity to help prevent future UTIs. Additionally, we talked about how bath oils might be contributing to your symptoms and recommended discontinuing their use. STD testing has also been ordered, including tests for chlamydia, gonorrhea, HIV, and herpes simplex virus.  INSTRUCTIONS: Please follow up with the results of your urine culture and STD tests. If your symptoms do not improve or worsen, contact our office. Avoid using bath oils and practice postcoital voiding to help prevent future UTIs.  Your Providers PCP: Job Lukes, PA,  [phone]) Referring Provider: Job Lukes, [place],  [phone])  NEXT STEPS: [x]  Early Intervention: Schedule sooner appointment, call our on-call services, or go to emergency room if there is any significant Increase in pain or discomfort New or worsening symptoms Sudden or severe changes in your health [x]  Flexible Follow-Up: We recommend a No follow-ups on  file. for optimal routine care. This allows for progress monitoring and treatment adjustments. [x]  Preventive Care: Schedule your annual preventive care visit! It's typically covered by insurance and helps identify potential health issues early. [x]  Lab & X-ray Appointments: Incomplete tests scheduled today, or call to schedule. X-rays: [facility] (M-F, 8:30am-noon or 1pm-5pm). [x]  Medical Information Release: Sign a release form at front desk to obtain relevant medical information we don't have.  MAKING THE MOST OF OUR FOCUSED 20 MINUTE APPOINTMENTS: [x]   Clearly state your top concerns at the beginning of the visit to focus our discussion [x]   If you anticipate you will need more time, please inform the front desk during scheduling - we can book multiple appointments in the same week. [x]   If you have transportation problems- use our convenient video appointments or ask about transportation support. [x]   We can get down to business faster if you use MyChart to update information before the visit and submit non-urgent questions before your visit. Thank you for taking the time to provide details through MyChart.  Let our nurse know and she can import this information into your encounter documents.  Arrival and Wait Times: [x]   Arriving on time ensures that everyone receives prompt attention. [x]   Early morning (8a) and afternoon (1p) appointments tend to have shortest wait times. [x]   Unfortunately, we cannot delay appointments for late arrivals or hold slots during phone calls.  Getting Answers and Following Up [x]   Simple Questions & Concerns: For quick questions or basic follow-up after your visit, reach us  at [phone] or MyChart messaging. [x]   Complex Concerns: If your concern is more complex, scheduling an appointment might be best. Discuss this with the staff to find the most  suitable option. [x]   Lab & Imaging Results: We'll contact you directly if results are  abnormal or you don't use MyChart. Most normal results will be on MyChart within 2-3 business days, with a review message from Dr. Jesus. Haven't heard back in 2 weeks? Need results sooner? Contact us  at [phone]. [x]   Referrals: Our referral coordinator will manage specialist referrals. The specialist's office should contact you within 2 weeks to schedule an appointment. Call us  if you haven't heard from them after 2 weeks.  Staying Connected [x]   MyChart: Activate your MyChart for the fastest way to access results and message us . See the last page of this paperwork for instructions on how to activate.  Bring to Your Next Appointment [x]   Medications: Please bring all your medication bottles to your next appointment to ensure we have an accurate record of your prescriptions. [x]   Health Diaries: If you're monitoring any health conditions at home, keeping a diary of your readings can be very helpful for discussions at your next appointment.  Billing [x]   X-ray & Lab Orders: These are billed by separate companies. Contact the invoicing company directly for questions or concerns. [x]   Visit Charges: Discuss any billing inquiries with our administrative services team.  Your Satisfaction Matters [x]   Share Your Experience: We strive for your satisfaction! If you have any complaints, or preferably compliments, please let Dr. Jesus know directly or contact our Practice Administrators, Manuelita Rubin or [employer], by asking at the front desk.   Reviewing Your Records [x]   Review this early draft of your clinical encounter notes below and the final encounter summary tomorrow on MyChart after its been completed.  All orders placed so far are visible here: Dysuria -     POCT Urinalysis Dipstick (Automated) -     Urine Culture; Future -     Urine cytology ancillary only -     Urine Culture -     HIV Antibody (routine testing w rflx) -     RPR -     Cephalexin ; Take 1 capsule (500 mg  total) by mouth 3 (three) times daily.  Dispense: 90 capsule; Refill: 3 -     HSV(herpes simplex vrs) 1+2 ab-IgG; Future -     Urinalysis, Routine w reflex microscopic; Future  Infective urethritis

## 2024-05-22 NOTE — Telephone Encounter (Signed)
 Noted

## 2024-05-22 NOTE — Progress Notes (Signed)
 ==============================  [facility]: [phone]   -- Medical Office Visit --  Patient: Mary Carlson      Age: 23 y.o.       Sex:  female  Date:   05/22/2024 Today's Healthcare Provider: Bernardino KANDICE Cone, MD  ==============================   Chief Complaint: Dysuria and Urinary Frequency (Was on abx was helping but has return since off abx)  Discussed the use of AI scribe software for clinical note transcription with the patient, who gave verbal consent to proceed.  History of Present Illness  23 year old female with a history of recurrent urinary tract infections who presents with symptoms of a urinary tract infection.  She experiences pelvic pain, urgency, frequency, and dysuria. There is a sensation of needing to urinate urgently, but only passing small amounts of urine, accompanied by bladder pain and pressure. These symptoms have recurred after an initial episode months ago, which was treated with doxycycline  and amoxicillin , leading to temporary symptom resolution.  She has a history of recurrent UTIs since childhood but notes that this is her first experience with a bladder infection. Previous urine cultures have shown the presence of Streptococcus agalactiae. Her symptoms have worsened recently, describing them as 'really bad'. She did not have a follow-up urinalysis after her initial treatment.  She has been sexually inactive for months prior to the onset of her bladder infection and denies any recent sexual activity that could contribute to her symptoms. She avoids douching as part of her hygiene practices. She occasionally takes baths and uses bath oils, which she suspects might contribute to her symptoms.  She has a history of bacterial vaginosis with Gardnerella, diagnosed in 2023, but denies any current symptoms suggestive of bacterial vaginosis or yeast infection, such as itching. She has had negative STD tests in the past but  requests retesting for STDs due to a new partner after her last test.  She is currently taking probiotics and cranberry supplements in an attempt to manage her symptoms. She recalls a previous adverse reaction to an antibiotic that caused stomach discomfort, possibly doxycycline  or Augmentin, and prefers to avoid these medications.  Background Reviewed: Problem List: has Severe recurrent major depression without psychotic features (HCC) and MDD (major depressive disorder), recurrent episode, severe (HCC) on their problem list. Past Medical History:  has a past medical history of ADHD (attention deficit hyperactivity disorder), Anxiety, COVID-19, Depression, and History of chicken pox. Past Surgical History:   has a past surgical history that includes Tonsillectomy (N/A, 03/02/2019); laparoscopic appendectomy (N/A, 08/06/2022); and Appendectomy (2023). Social History:   reports that she quit smoking about 3 years ago. Her smoking use included e-cigarettes. She has never used smokeless tobacco. She reports that she does not currently use alcohol after a past usage of about 15.0 standard drinks of alcohol per week. She reports that she does not use drugs. Family History:  family history includes Cancer in her maternal grandmother; Diabetes in her paternal grandmother; Hyperlipidemia in her paternal grandfather and paternal grandmother; Hypertension in her paternal grandfather and paternal grandmother. Allergies:  is allergic to food, other, watermelon [citrullus vulgaris], and banana.   Medication Reconciliation: Current Outpatient Medications on File Prior to Visit  Medication Sig   albuterol  (VENTOLIN  HFA) 108 (90 Base) MCG/ACT inhaler Inhale 2 puffs into the lungs every 6 (six) hours as needed for wheezing or shortness of breath.   amphetamine -dextroamphetamine  (ADDERALL XR) 25 MG 24 hr capsule Take 1 capsule by mouth every morning.  amphetamine -dextroamphetamine  (ADDERALL) 20 MG tablet Take 10 mg  by mouth 2 (two) times daily.   etonogestrel (NEXPLANON) 68 MG IMPL implant    lisdexamfetamine (VYVANSE ) 30 MG capsule Take 1 capsule (30 mg total) by mouth every morning.   lisdexamfetamine (VYVANSE ) 30 MG capsule Take 1 capsule (30 mg total) by mouth every morning.   lisdexamfetamine (VYVANSE ) 30 MG capsule Take 1 capsule (30 mg total) by mouth every morning.   lisdexamfetamine (VYVANSE ) 30 MG capsule Take 1 capsule (30 mg total) by mouth every morning.   lisdexamfetamine (VYVANSE ) 30 MG capsule Take 1 capsule (30 mg total) by mouth in the morning.   lisdexamfetamine (VYVANSE ) 30 MG capsule Take 1 capsule (30 mg total) by mouth in the morning.   lisdexamfetamine (VYVANSE ) 30 MG capsule Take 1 capsule (30 mg total) by mouth in the morning.   lisdexamfetamine (VYVANSE ) 30 MG capsule Take 1 capsule (30 mg total) by mouth every morning. (03/22/24)   lisdexamfetamine (VYVANSE ) 30 MG capsule Take 1 capsule (30 mg total) by mouth every morning. (02/21/24)   lisdexamfetamine (VYVANSE ) 30 MG capsule Take 1 capsule (30 mg total) by mouth in the morning.   [START ON 05/28/2024] lisdexamfetamine (VYVANSE ) 30 MG capsule Take 1 capsule (30 mg total) by mouth in the morning.   lisdexamfetamine (VYVANSE ) 40 MG capsule Take 1 capsule orally every morning   Multiple Vitamins-Minerals (MULTI-VITAMIN GUMMIES PO) Take 2 each by mouth daily in the afternoon.   ondansetron  (ZOFRAN ) 4 MG tablet Take 1 tablet (4 mg total) by mouth every 8 (eight) hours as needed for nausea or vomiting. (Patient not taking: Reported on 05/22/2024)   predniSONE  (DELTASONE ) 20 MG tablet Take 3 tabs PO daily x 5 days. (Patient not taking: Reported on 05/22/2024)   propranolol  (INDERAL ) 10 MG tablet Take 1 tablet (10 mg total) by mouth 2 (two) times daily.   No current facility-administered medications on file prior to visit.   Medications Discontinued During This Encounter  Medication Reason   cephALEXin  (KEFLEX ) 500 MG capsule       Physical Exam:    05/22/2024    1:23 PM 05/09/2024    4:27 PM 04/11/2023    2:00 PM  Vitals with BMI  Height 5' 5    Weight 220 lbs    BMI 36.61    Systolic 110 116 886  Diastolic 74 73 75  Pulse 83 85 81  Vital signs reviewed.  Nursing notes reviewed. Weight trend reviewed. Physical Activity: Not on file   General Appearance:  No acute distress appreciable.   Well-groomed, healthy-appearing female.  Well proportioned with no abnormal fat distribution.  Good muscle tone. Pulmonary:  Normal work of breathing at rest, no respiratory distress apparent. SpO2: 98 %  Musculoskeletal: All extremities are intact.  Neurological:  Awake, alert, oriented, and engaged.  No obvious focal neurological deficits or cognitive impairments.  Sensorium seems unclouded.   Speech is clear and coherent with logical content. Psychiatric:  Appropriate mood, pleasant and cooperative demeanor, thoughtful and engaged during the exam Results LABS   Urinalysis: White blood cells present (05/22/2024)   Urine culture: Streptococcus agalactiae (03/2023)   STD test: Positive for bacterial vaginosis with Gardnerella (2023)     11/10/2021    2:52 PM 07/06/2021    3:35 PM 07/06/2021    3:34 PM 12/27/2019    3:42 PM  PHQ 2/9 Scores  PHQ - 2 Score 0 0 0   PHQ- 9 Score 0  0  0  Information is confidential and restricted. Go to Review Flowsheets to unlock data.   Data saved with a previous flowsheet row definition    {   Results for orders placed or performed in visit on 05/22/24  POCT Urinalysis Dipstick (Automated)  Result Value Ref Range   Color, UA yellow    Clarity, UA clear    Glucose, UA Negative Negative   Bilirubin, UA neg    Ketones, UA neg    Spec Grav, UA 1.010 1.010 - 1.025   Blood, UA neg    pH, UA 6.5 5.0 - 8.0   Protein, UA Negative Negative   Urobilinogen, UA 0.2 0.2 or 1.0 E.U./dL   Nitrite, UA neg    Leukocytes, UA Trace (A) Negative  } Office Visit on 05/22/2024  Component Date  Value Ref Range Status   Color, UA 05/22/2024 yellow   Final   Clarity, UA 05/22/2024 clear   Final   Glucose, UA 05/22/2024 Negative  Negative Final   Bilirubin, UA 05/22/2024 neg   Final   Ketones, UA 05/22/2024 neg   Final   Spec Grav, UA 05/22/2024 1.010  1.010 - 1.025 Final   Blood, UA 05/22/2024 neg   Final   pH, UA 05/22/2024 6.5  5.0 - 8.0 Final   Protein, UA 05/22/2024 Negative  Negative Final   Urobilinogen, UA 05/22/2024 0.2  0.2 or 1.0 E.U./dL Final   Nitrite, UA 88/80/7974 neg   Final   Leukocytes, UA 05/22/2024 Trace (A)  Negative Final  Admission on 05/09/2024, Discharged on 05/09/2024  Component Date Value Ref Range Status   SARS Coronavirus 2 Ag 05/09/2024 Negative  Negative Final   Influenza A, POC 05/09/2024 Negative  Negative Final   Influenza B, POC 05/09/2024 Negative  Negative Final   Rapid Strep A Screen 05/09/2024 Negative  Negative Final  Office Visit on 03/28/2023  Component Date Value Ref Range Status   Neisseria Gonorrhea 03/28/2023 Negative   Final   Chlamydia 03/28/2023 Negative   Final   Trichomonas 03/28/2023 Negative   Final   Bacterial Vaginitis (gardnerella) 03/28/2023 Negative   Final   Candida Vaginitis 03/28/2023 Negative   Final   Candida Glabrata 03/28/2023 Negative   Final   Comment 03/28/2023 Normal Reference Range Bacterial Vaginosis - Negative   Final   Comment 03/28/2023 Normal Reference Range Candida Species - Negative   Final   Comment 03/28/2023 Normal Reference Range Candida Galbrata - Negative   Final   Comment 03/28/2023 Normal Reference Range Trichomonas - Negative   Final   Comment 03/28/2023 Normal Reference Ranger Chlamydia - Negative   Final   Comment 03/28/2023 Normal Reference Range Neisseria Gonorrhea - Negative   Final   HIV 1&2 Ab, 4th Generation 03/28/2023 NON-REACTIVE  NON-REACTIVE Final   RPR Ser Ql 03/28/2023 NON-REACTIVE  NON-REACTIVE Final   Preg Test, Ur 03/28/2023 Negative  Negative Final   Color, Urine  03/28/2023 YELLOW  Yellow;Lt. Yellow;Straw;Dark Yellow;Amber;Green;Red;Brown Final   APPearance 03/28/2023 CLEAR  Clear;Turbid;Slightly Cloudy;Cloudy Final   Specific Gravity, Urine 03/28/2023 1.020  1.000 - 1.030 Final   pH 03/28/2023 6.0  5.0 - 8.0 Final   Total Protein, Urine 03/28/2023 NEGATIVE  Negative Final   Urine Glucose 03/28/2023 NEGATIVE  Negative Final   Ketones, ur 03/28/2023 NEGATIVE  Negative Final   Bilirubin Urine 03/28/2023 NEGATIVE  Negative Final   Hgb urine dipstick 03/28/2023 NEGATIVE  Negative Final   Urobilinogen, UA 03/28/2023 0.2  0.0 - 1.0 Final   Leukocytes,Ua 03/28/2023  TRACE (A)  Negative Final   Nitrite 03/28/2023 NEGATIVE  Negative Final   WBC, UA 03/28/2023 3-6/hpf (A)  0-2/hpf Final   RBC / HPF 03/28/2023 0-2/hpf  0-2/hpf Final   Mucus, UA 03/28/2023 Presence of (A)  None Final   Squamous Epithelial / HPF 03/28/2023 Rare(0-4/hpf)  Rare(0-4/hpf) Final   MICRO NUMBER: 03/28/2023 [id number]   Final   SPECIMEN QUALITY: 03/28/2023 Adequate   Final   Sample Source 03/28/2023 NOT GIVEN   Final   STATUS: 03/28/2023 FINAL   Final   Result: 03/28/2023    Final                   Value:Mixed genital flora isolated. These superficial bacteria are not indicative of a urinary tract infection. No further organism identification is warranted on this specimen. If clinically indicated, recollect clean-catch, mid-stream urine and transfer  immediately to Urine Culture Transport Tube.    ISOLATE 1: 03/28/2023 Streptococcus agalactiae (A)   Final  Admission on 02/16/2023, Discharged on 02/16/2023  Component Date Value Ref Range Status   Sodium 02/16/2023 139  135 - 145 mmol/L Final   Potassium 02/16/2023 4.0  3.5 - 5.1 mmol/L Final   Chloride 02/16/2023 105  98 - 111 mmol/L Final   CO2 02/16/2023 27  22 - 32 mmol/L Final   Glucose, Bld 02/16/2023 90  70 - 99 mg/dL Final   BUN 91/84/7975 9  6 - 20 mg/dL Final   Creatinine, Ser 02/16/2023 1.00  0.44 - 1.00 mg/dL Final    Calcium  02/16/2023 9.9  8.9 - 10.3 mg/dL Final   Total Protein 91/84/7975 8.1  6.5 - 8.1 g/dL Final   Albumin 91/84/7975 4.8  3.5 - 5.0 g/dL Final   AST 91/84/7975 17  15 - 41 U/L Final   ALT 02/16/2023 19  0 - 44 U/L Final   Alkaline Phosphatase 02/16/2023 68  38 - 126 U/L Final   Total Bilirubin 02/16/2023 0.5  0.3 - 1.2 mg/dL Final   GFR, Estimated 02/16/2023 >60  >60 mL/min Final   Anion gap 02/16/2023 7  5 - 15 Final   Lipase 02/16/2023 24  11 - 51 U/L Final   WBC 02/16/2023 6.0  4.0 - 10.5 K/uL Final   RBC 02/16/2023 5.00  3.87 - 5.11 MIL/uL Final   Hemoglobin 02/16/2023 13.9  12.0 - 15.0 g/dL Final   HCT 91/84/7975 42.7  36.0 - 46.0 % Final   MCV 02/16/2023 85.4  80.0 - 100.0 fL Final   MCH 02/16/2023 27.8  26.0 - 34.0 pg Final   MCHC 02/16/2023 32.6  30.0 - 36.0 g/dL Final   RDW 91/84/7975 13.6  11.5 - 15.5 % Final   Platelets 02/16/2023 268  150 - 400 K/uL Final   nRBC 02/16/2023 0.0  0.0 - 0.2 % Final   Neutrophils Relative % 02/16/2023 61  % Final   Neutro Abs 02/16/2023 3.6  1.7 - 7.7 K/uL Final   Lymphocytes Relative 02/16/2023 32  % Final   Lymphs Abs 02/16/2023 1.9  0.7 - 4.0 K/uL Final   Monocytes Relative 02/16/2023 4  % Final   Monocytes Absolute 02/16/2023 0.3  0.1 - 1.0 K/uL Final   Eosinophils Relative 02/16/2023 2  % Final   Eosinophils Absolute 02/16/2023 0.1  0.0 - 0.5 K/uL Final   Basophils Relative 02/16/2023 1  % Final   Basophils Absolute 02/16/2023 0.0  0.0 - 0.1 K/uL Final   Immature Granulocytes 02/16/2023 0  % Final  Abs Immature Granulocytes 02/16/2023 0.01  0.00 - 0.07 K/uL Final   Preg Test, Ur 02/16/2023 NEGATIVE  NEGATIVE Final   Color, Urine 02/16/2023 COLORLESS (A)  YELLOW Final   APPearance 02/16/2023 CLEAR  CLEAR Final   Specific Gravity, Urine 02/16/2023 1.008  1.005 - 1.030 Final   pH 02/16/2023 7.0  5.0 - 8.0 Final   Glucose, UA 02/16/2023 NEGATIVE  NEGATIVE mg/dL Final   Hgb urine dipstick 02/16/2023 NEGATIVE  NEGATIVE Final    Bilirubin Urine 02/16/2023 NEGATIVE  NEGATIVE Final   Ketones, ur 02/16/2023 NEGATIVE  NEGATIVE mg/dL Final   Protein, ur 91/84/7975 NEGATIVE  NEGATIVE mg/dL Final   Nitrite 91/84/7975 NEGATIVE  NEGATIVE Final   Leukocytes,Ua 02/16/2023 LARGE (A)  NEGATIVE Final   RBC / HPF 02/16/2023 0-5  0 - 5 RBC/hpf Final   WBC, UA 02/16/2023 >50  0 - 5 WBC/hpf Final   Bacteria, UA 02/16/2023 NONE SEEN  NONE SEEN Final   Squamous Epithelial / HPF 02/16/2023 0-5  0 - 5 /HPF Final  Admission on 09/06/2022, Discharged on 09/06/2022  Component Date Value Ref Range Status   Lipase 09/06/2022 29  11 - 51 U/L Final   Sodium 09/06/2022 137  135 - 145 mmol/L Final   Potassium 09/06/2022 3.9  3.5 - 5.1 mmol/L Final   Chloride 09/06/2022 105  98 - 111 mmol/L Final   CO2 09/06/2022 24  22 - 32 mmol/L Final   Glucose, Bld 09/06/2022 86  70 - 99 mg/dL Final   BUN 96/94/7975 8  6 - 20 mg/dL Final   Creatinine, Ser 09/06/2022 0.76  0.44 - 1.00 mg/dL Final   Calcium  09/06/2022 10.2  8.9 - 10.3 mg/dL Final   Total Protein 96/94/7975 8.4 (H)  6.5 - 8.1 g/dL Final   Albumin 96/94/7975 4.8  3.5 - 5.0 g/dL Final   AST 96/94/7975 15  15 - 41 U/L Final   ALT 09/06/2022 16  0 - 44 U/L Final   Alkaline Phosphatase 09/06/2022 73  38 - 126 U/L Final   Total Bilirubin 09/06/2022 0.3  0.3 - 1.2 mg/dL Final   GFR, Estimated 09/06/2022 >60  >60 mL/min Final   Anion gap 09/06/2022 8  5 - 15 Final   WBC 09/06/2022 8.0  4.0 - 10.5 K/uL Final   RBC 09/06/2022 4.83  3.87 - 5.11 MIL/uL Final   Hemoglobin 09/06/2022 13.4  12.0 - 15.0 g/dL Final   HCT 96/94/7975 40.5  36.0 - 46.0 % Final   MCV 09/06/2022 83.9  80.0 - 100.0 fL Final   MCH 09/06/2022 27.7  26.0 - 34.0 pg Final   MCHC 09/06/2022 33.1  30.0 - 36.0 g/dL Final   RDW 96/94/7975 13.5  11.5 - 15.5 % Final   Platelets 09/06/2022 241  150 - 400 K/uL Final   nRBC 09/06/2022 0.0  0.0 - 0.2 % Final   Color, Urine 09/06/2022 COLORLESS (A)  YELLOW Final   APPearance 09/06/2022  CLEAR  CLEAR Final   Specific Gravity, Urine 09/06/2022 <1.005 (L)  1.005 - 1.030 Final   pH 09/06/2022 6.5  5.0 - 8.0 Final   Glucose, UA 09/06/2022 NEGATIVE  NEGATIVE mg/dL Final   Hgb urine dipstick 09/06/2022 NEGATIVE  NEGATIVE Final   Bilirubin Urine 09/06/2022 NEGATIVE  NEGATIVE Final   Ketones, ur 09/06/2022 NEGATIVE  NEGATIVE mg/dL Final   Protein, ur 96/94/7975 NEGATIVE  NEGATIVE mg/dL Final   Nitrite 96/94/7975 NEGATIVE  NEGATIVE Final   Leukocytes,Ua 09/06/2022 NEGATIVE  NEGATIVE  Final   Preg Test, Ur 09/06/2022 NEGATIVE  NEGATIVE Final  Admission on 08/06/2022, Discharged on 08/06/2022  Component Date Value Ref Range Status   WBC 08/06/2022 12.6 (H)  4.0 - 10.5 K/uL Final   RBC 08/06/2022 4.68  3.87 - 5.11 MIL/uL Final   Hemoglobin 08/06/2022 13.0  12.0 - 15.0 g/dL Final   HCT 97/96/7975 39.5  36.0 - 46.0 % Final   MCV 08/06/2022 84.4  80.0 - 100.0 fL Final   MCH 08/06/2022 27.8  26.0 - 34.0 pg Final   MCHC 08/06/2022 32.9  30.0 - 36.0 g/dL Final   RDW 97/96/7975 13.5  11.5 - 15.5 % Final   Platelets 08/06/2022 248  150 - 400 K/uL Final   nRBC 08/06/2022 0.0  0.0 - 0.2 % Final   Neutrophils Relative % 08/06/2022 76  % Final   Neutro Abs 08/06/2022 9.6 (H)  1.7 - 7.7 K/uL Final   Lymphocytes Relative 08/06/2022 16  % Final   Lymphs Abs 08/06/2022 2.0  0.7 - 4.0 K/uL Final   Monocytes Relative 08/06/2022 7  % Final   Monocytes Absolute 08/06/2022 0.8  0.1 - 1.0 K/uL Final   Eosinophils Relative 08/06/2022 1  % Final   Eosinophils Absolute 08/06/2022 0.1  0.0 - 0.5 K/uL Final   Basophils Relative 08/06/2022 0  % Final   Basophils Absolute 08/06/2022 0.0  0.0 - 0.1 K/uL Final   Immature Granulocytes 08/06/2022 0  % Final   Abs Immature Granulocytes 08/06/2022 0.03  0.00 - 0.07 K/uL Final   Sodium 08/06/2022 139  135 - 145 mmol/L Final   Potassium 08/06/2022 3.9  3.5 - 5.1 mmol/L Final   Chloride 08/06/2022 106  98 - 111 mmol/L Final   CO2 08/06/2022 23  22 - 32 mmol/L  Final   Glucose, Bld 08/06/2022 87  70 - 99 mg/dL Final   BUN 97/96/7975 11  6 - 20 mg/dL Final   Creatinine, Ser 08/06/2022 0.81  0.44 - 1.00 mg/dL Final   Calcium  08/06/2022 9.4  8.9 - 10.3 mg/dL Final   Total Protein 97/96/7975 7.7  6.5 - 8.1 g/dL Final   Albumin 97/96/7975 4.4  3.5 - 5.0 g/dL Final   AST 97/96/7975 15  15 - 41 U/L Final   ALT 08/06/2022 18  0 - 44 U/L Final   Alkaline Phosphatase 08/06/2022 50  38 - 126 U/L Final   Total Bilirubin 08/06/2022 0.3  0.3 - 1.2 mg/dL Final   GFR, Estimated 08/06/2022 >60  >60 mL/min Final   Anion gap 08/06/2022 10  5 - 15 Final   Lipase 08/06/2022 17  11 - 51 U/L Final   Color, Urine 08/06/2022 COLORLESS (A)  YELLOW Final   APPearance 08/06/2022 CLEAR  CLEAR Final   Specific Gravity, Urine 08/06/2022 1.007  1.005 - 1.030 Final   pH 08/06/2022 7.0  5.0 - 8.0 Final   Glucose, UA 08/06/2022 NEGATIVE  NEGATIVE mg/dL Final   Hgb urine dipstick 08/06/2022 NEGATIVE  NEGATIVE Final   Bilirubin Urine 08/06/2022 NEGATIVE  NEGATIVE Final   Ketones, ur 08/06/2022 NEGATIVE  NEGATIVE mg/dL Final   Protein, ur 97/96/7975 NEGATIVE  NEGATIVE mg/dL Final   Nitrite 97/96/7975 NEGATIVE  NEGATIVE Final   Leukocytes,Ua 08/06/2022 SMALL (A)  NEGATIVE Final   RBC / HPF 08/06/2022 0-5  0 - 5 RBC/hpf Final   WBC, UA 08/06/2022 0-5  0 - 5 WBC/hpf Final   Bacteria, UA 08/06/2022 RARE (A)  NONE SEEN Final  Squamous Epithelial / HPF 08/06/2022 0-5  0 - 5 /HPF Final   Mucus 08/06/2022 PRESENT   Final   Preg Test, Ur 08/06/2022 NEGATIVE  NEGATIVE Final   SARS Coronavirus 2 by RT PCR 08/06/2022 NEGATIVE  NEGATIVE Final   Influenza A by PCR 08/06/2022 NEGATIVE  NEGATIVE Final   Influenza B by PCR 08/06/2022 NEGATIVE  NEGATIVE Final   Resp Syncytial Virus by PCR 08/06/2022 NEGATIVE  NEGATIVE Final   Opiates 08/06/2022 NONE DETECTED  NONE DETECTED Final   Cocaine 08/06/2022 NONE DETECTED  NONE DETECTED Final   Benzodiazepines 08/06/2022 NONE DETECTED  NONE DETECTED  Final   Amphetamines 08/06/2022 NONE DETECTED  NONE DETECTED Final   Tetrahydrocannabinol 08/06/2022 NONE DETECTED  NONE DETECTED Final   Barbiturates 08/06/2022 NONE DETECTED  NONE DETECTED Final   SURGICAL PATHOLOGY 08/06/2022    Final-Edited                   Value:SURGICAL PATHOLOGY CASE: [id number] PATIENT: KIPP AO Surgical Pathology Report     Clinical History: Acute appendicitis (nt)     FINAL MICROSCOPIC DIAGNOSIS:  A. APPENDIX, APPENDECTOMY: Marked acute appendicitis with acute fibropurulent serositis   GROSS DESCRIPTION:  A.  Specimen: received fresh and subsequently placed in formalin labeled with the patient's name and Appendix is an intact vermiform appendix. Size: 7.0 X 0.7 cm with up to 1.5 cm of attached mesoappendix. Serosa: red-tan and ragged with tan fibrinous exudate. Mucosa: red-tan and pillowy. Wall: 0.1 cm thick and indurated without a discrete transmural defect. Lumen: 0.3 cm in diameter. A fecalith is not grossly identified. Block Summary: representative sections, to include the proximal margin (inked black) and half of the distal tip, are submitted in a single cassette.  (LEF 08/08/2022(   Final Diagnosis performed by Prentice Pitcher, MD.   Electronically signed 08/09/2022 Te                         chnical and / or Professional components performed at [facility]. [facility], [address], [place], [place] [address].  Immunohistochemistry Technical component (if applicable) was performed at [facility]. [address], [place], [place] [address].   IMMUNOHISTOCHEMISTRY DISCLAIMER (if applicable): Some of these immunohistochemical stains may have been developed and the performance characteristics determine by [facility]. Some may not have been cleared or approved by the U.S. Food and Drug Administration. The FDA has determined that such clearance or approval is not necessary.  This test is used for clinical purposes. It should not be regarded as investigational or for research. This laboratory is certified under the Clinical Laboratory Improvement Amendments of 1988 (CLIA-88) as qualified to perform high complexity clinical laboratory testing.  The controls stained appropriately.   No image results found. No results found.       ASSESSMENT & PLAN   Assessment & Plan Dysuria Infective urethritis Recurrent urinary tract infection with dysuria  - most likely due to sugary bath oils based on our discussions. Recurrent urinary tract infection with dysuria is likely due to Streptococcus agalactiae, presenting with severe pelvic pain, urgency, and burning during urination. Previous doxycycline  and amoxicillin  treatments were partially effective. Differential diagnosis includes bacterial vaginosis, sexually transmitted infections, and yeast infection, though these are less likely. Urinalysis shows white blood cells, consistent with UTI. Discussed potential anatomical predisposition and hygiene practices contributing to recurrence, and considered bath oil use as a potential irritant. Ordered urine culture and  cytology to confirm diagnosis and rule out other infections. Prescribed Keflex  for suspected Streptococcus agalactiae infection. Educated on proper midstream urine collection technique and advised on postcoital voiding to prevent UTIs. Discussed potential impact of bath oil use on symptoms and advised discontinuation. Ordered STD testing including chlamydia, gonorrhea, and HIV, as well as a herpes simplex virus antibody test.  She got faint at lab so didn't go ahead with the bloodwork.  ORDER ASSOCIATIONS  #   DIAGNOSIS / CONDITION ICD-10 ENCOUNTER ORDER     ICD-10-CM   1. Dysuria  R30.0 POCT Urinalysis Dipstick (Automated)    Urine Culture    Urine cytology ancillary only    Urine Culture    HIV antibody (with reflex)    RPR    HSV(herpes simplex vrs) 1+2  ab-IgG    cephALEXin  (KEFLEX ) 500 MG capsule         Orders Placed in Encounter:  Lab Orders         Urine Culture         Urine Culture         HIV antibody (with reflex)         RPR         HSV(herpes simplex vrs) 1+2 ab-IgG         POCT Urinalysis Dipstick (Automated)     Meds ordered this encounter  Medications   cephALEXin  (KEFLEX ) 500 MG capsule    Sig: Take 1 capsule (500 mg total) by mouth 3 (three) times daily.    Dispense:  90 capsule    Refill:  3   Orders Placed This Encounter  Procedures   Urine Culture    Standing Status:   Future    Expiration Date:   05/22/2025   Urine Culture   HIV antibody (with reflex)   RPR   HSV(herpes simplex vrs) 1+2 ab-IgG   POCT Urinalysis Dipstick (Automated)      This document was synthesized by artificial intelligence ([employer]bridge) using HIPAA-compliant recording of the clinical interaction;   We discussed the use of AI scribe software for clinical note transcription with the patient, who gave verbal consent to proceed. additional Info: This encounter employed state-of-the-art, real-time, collaborative documentation. The patient actively reviewed and assisted in updating their electronic medical record on a shared screen, ensuring transparency and facilitating joint problem-solving for the problem list, overview, and plan. This approach promotes accurate, informed care. The treatment plan was discussed and reviewed in detail, including medication safety, potential side effects, and all patient questions. We confirmed understanding and comfort with the plan. Follow-up instructions were established, including contacting the office for any concerns, returning if symptoms worsen, persist, or new symptoms develop, and precautions for potential emergency department visits.

## 2024-05-22 NOTE — Telephone Encounter (Signed)
Please call pt to reschedule appt

## 2024-05-22 NOTE — Telephone Encounter (Signed)
 FYI: LVM to reschedule appt for 05/22/24. Needs to be scheduled with any available provider today.

## 2024-05-22 NOTE — Addendum Note (Signed)
 Addended by: Melvie Paglia Y on: 05/22/2024 02:21 PM   Modules accepted: Orders

## 2024-05-22 NOTE — Telephone Encounter (Signed)
 Copied from CRM [id number]. Topic: Clinical - Refused Triage >> May 22, 2024  8:40 AM Mesmerise C wrote: Patient/caller voiced complaints of not wanting to speak to contact center and wanting to speak to clinic to reschedule her appointment due to provider not being in. Declined transfer to triage. Called [facility] to assist and transferred over

## 2024-05-23 LAB — URINE CYTOLOGY ANCILLARY ONLY
Chlamydia: NEGATIVE
Comment: NEGATIVE
Comment: NEGATIVE
Comment: NORMAL
Neisseria Gonorrhea: NEGATIVE
Trichomonas: NEGATIVE

## 2024-05-23 LAB — URINE CULTURE
MICRO NUMBER:: 17256749
Result:: NO GROWTH
SPECIMEN QUALITY:: ADEQUATE

## 2024-05-27 ENCOUNTER — Ambulatory Visit: Payer: Self-pay | Admitting: Internal Medicine

## 2024-06-03 ENCOUNTER — Other Ambulatory Visit (HOSPITAL_COMMUNITY): Payer: Self-pay

## 2024-06-05 ENCOUNTER — Other Ambulatory Visit

## 2024-06-12 ENCOUNTER — Other Ambulatory Visit (HOSPITAL_COMMUNITY): Payer: Self-pay

## 2024-06-12 MED ORDER — PROPRANOLOL HCL 10 MG PO TABS
10.0000 mg | ORAL_TABLET | Freq: Two times a day (BID) | ORAL | 0 refills | Status: AC
  Filled 2024-06-12: qty 30, 15d supply, fill #0

## 2024-06-19 ENCOUNTER — Other Ambulatory Visit (HOSPITAL_COMMUNITY): Payer: Self-pay

## 2024-06-19 ENCOUNTER — Other Ambulatory Visit: Payer: Self-pay

## 2024-06-19 MED ORDER — REXULTI 0.5 MG PO TABS
0.5000 mg | ORAL_TABLET | Freq: Every day | ORAL | 0 refills | Status: DC
  Filled 2024-06-19: qty 30, 30d supply, fill #0

## 2024-06-19 MED ORDER — LISDEXAMFETAMINE DIMESYLATE 30 MG PO CAPS
30.0000 mg | ORAL_CAPSULE | Freq: Every morning | ORAL | 0 refills | Status: AC
  Filled 2024-07-08: qty 3, 3d supply, fill #0
  Filled 2024-07-08: qty 30, 30d supply, fill #0
  Filled 2024-07-08: qty 3, 3d supply, fill #0
  Filled 2024-07-09: qty 3, 3d supply, fill #1
  Filled 2024-07-12: qty 27, 27d supply, fill #1

## 2024-06-20 ENCOUNTER — Other Ambulatory Visit (HOSPITAL_COMMUNITY): Payer: Self-pay

## 2024-06-21 ENCOUNTER — Other Ambulatory Visit (HOSPITAL_COMMUNITY): Payer: Self-pay

## 2024-07-08 ENCOUNTER — Other Ambulatory Visit (HOSPITAL_COMMUNITY): Payer: Self-pay

## 2024-07-09 ENCOUNTER — Other Ambulatory Visit (HOSPITAL_COMMUNITY): Payer: Self-pay

## 2024-07-10 ENCOUNTER — Other Ambulatory Visit (HOSPITAL_COMMUNITY): Payer: Self-pay

## 2024-07-12 ENCOUNTER — Other Ambulatory Visit: Payer: Self-pay

## 2024-07-12 ENCOUNTER — Other Ambulatory Visit (HOSPITAL_COMMUNITY): Payer: Self-pay

## 2024-07-12 MED ORDER — LISDEXAMFETAMINE DIMESYLATE 30 MG PO CAPS
30.0000 mg | ORAL_CAPSULE | Freq: Every morning | ORAL | 0 refills | Status: AC
  Filled 2024-07-12 – 2024-08-09 (×2): qty 30, 30d supply, fill #0

## 2024-07-12 MED ORDER — LISDEXAMFETAMINE DIMESYLATE 30 MG PO CAPS: 30.0000 mg | ORAL_CAPSULE | Freq: Every morning | ORAL | 0 refills | Status: AC

## 2024-07-12 MED ORDER — REXULTI 0.5 MG PO TABS
0.5000 mg | ORAL_TABLET | Freq: Every day | ORAL | 1 refills | Status: AC
  Filled 2024-07-12: qty 30, 30d supply, fill #0

## 2024-07-16 ENCOUNTER — Other Ambulatory Visit (HOSPITAL_COMMUNITY): Payer: Self-pay

## 2024-07-26 ENCOUNTER — Other Ambulatory Visit (HOSPITAL_COMMUNITY)
Admission: RE | Admit: 2024-07-26 | Discharge: 2024-07-26 | Disposition: A | Source: Ambulatory Visit | Attending: Physician Assistant | Admitting: Physician Assistant

## 2024-07-26 ENCOUNTER — Ambulatory Visit: Admitting: Physician Assistant

## 2024-07-26 ENCOUNTER — Encounter: Payer: Self-pay | Admitting: Physician Assistant

## 2024-07-26 VITALS — BP 110/78 | HR 77 | Temp 98.0°F | Ht 65.0 in | Wt 222.0 lb

## 2024-07-26 DIAGNOSIS — N898 Other specified noninflammatory disorders of vagina: Secondary | ICD-10-CM | POA: Diagnosis present

## 2024-07-26 DIAGNOSIS — R1013 Epigastric pain: Secondary | ICD-10-CM | POA: Diagnosis not present

## 2024-07-26 DIAGNOSIS — R300 Dysuria: Secondary | ICD-10-CM | POA: Diagnosis not present

## 2024-07-26 LAB — POCT URINALYSIS DIPSTICK
Bilirubin, UA: NEGATIVE
Blood, UA: NEGATIVE
Glucose, UA: NEGATIVE
Ketones, UA: NEGATIVE
Nitrite, UA: NEGATIVE
Protein, UA: NEGATIVE
Spec Grav, UA: 1.01
Urobilinogen, UA: 0.2 U/dL
pH, UA: 6.5

## 2024-07-26 MED ORDER — SUCRALFATE 1 G PO TABS: 1.0000 g | ORAL_TABLET | Freq: Three times a day (TID) | ORAL | 0 refills | Status: AC

## 2024-07-26 MED ORDER — PANTOPRAZOLE SODIUM 40 MG PO TBEC: 40.0000 mg | DELAYED_RELEASE_TABLET | Freq: Every day | ORAL | 0 refills | Status: AC

## 2024-07-26 NOTE — Progress Notes (Signed)
 "  History of Present Illness:   Chief Complaint  Patient presents with   Dysuria    Pt c/o burning with urination x several weeks.    STD screening    Discussed the use of AI scribe software for clinical note transcription with the patient, who gave verbal consent to proceed.  History of Present Illness   Mary Carlson is a 24 year old female who presents with shortness of breath and abdominal bloating.  For the past two weeks she has had significant abdominal bloating that makes her feel like she looks pregnant when standing, with mid-abdominal pain rated 5/10. She feels very full quickly, has marked nausea, and has shortness of breath that is worse after eating. She has health anxiety and is concerned symptoms may be from an infection or a reaction to Rexulti , which she stopped abruptly and then restarted on January 9.  She had recent visit in our office for UTI-type symptoms and was prescribed cephalexin  in November. She currently feels intermittent pattering and mild burning in her bladder. She has not taken antibiotics recently.  Current medications are Rexulti  restarted January 9 and Vyvanse  30 mg daily. She is not taking prescribed propranolol . She has an albuterol  inhaler but has not used it for the shortness of breath.  She uses ibuprofen only occasionally for migraines with aura since an appendectomy and denies black or bright red stools. She recently had brown vaginal discharge followed by bleeding after intercourse, which she had not experienced in a long time.  She recently moved into her own apartment, is working regularly, and is satisfied with her job. She has stopped smoking, stopped drinking alcohol, and exercises daily. She denies current alcohol use or vaping.        Past Medical History:  Diagnosis Date   ADHD (attention deficit hyperactivity disorder)    Anxiety    COVID-19    2023   Depression    History of chicken pox      Social History[1]  Past  Surgical History:  Procedure Laterality Date   APPENDECTOMY  2023   LAPAROSCOPIC APPENDECTOMY N/A 08/06/2022   Procedure: APPENDECTOMY LAPAROSCOPIC;  Surgeon: Teresa Lonni HERO, MD;  Location: [facility] OR;  Service: General;  Laterality: N/A;   TONSILLECTOMY N/A 03/02/2019    Family History  Problem Relation Age of Onset   Cancer Maternal Grandmother    Hypertension Paternal Grandmother    Hyperlipidemia Paternal Grandmother    Diabetes Paternal Grandmother    Hypertension Paternal Grandfather    Hyperlipidemia Paternal Grandfather     Allergies[2]  Current Medications:  Current Medications[3]   Review of Systems:   Negative unless otherwise specified per HPI.  Vitals:   Vitals:   07/26/24 1436  BP: 110/78  Pulse: 77  Temp: 98 F (36.7 C)  TempSrc: Temporal  SpO2: 96%  Weight: 222 lb (100.7 kg)  Height: 5' 5 (1.651 m)     Body mass index is 36.94 kg/m.  Physical Exam:   Physical Exam Vitals and nursing note reviewed.  Constitutional:      General: She is not in acute distress.    Appearance: She is well-developed. She is not ill-appearing or toxic-appearing.  Cardiovascular:     Rate and Rhythm: Normal rate and regular rhythm.     Pulses: Normal pulses.     Heart sounds: Normal heart sounds, S1 normal and S2 normal.  Pulmonary:     Effort: Pulmonary effort is normal.     Breath sounds:  Normal breath sounds.  Abdominal:     General: Abdomen is flat. Bowel sounds are normal.     Palpations: Abdomen is soft.     Tenderness: There is abdominal tenderness in the right upper quadrant, epigastric area and left upper quadrant.  Skin:    General: Skin is warm and dry.  Neurological:     Mental Status: She is alert.     GCS: GCS eye subscore is 4. GCS verbal subscore is 5. GCS motor subscore is 6.  Psychiatric:        Speech: Speech normal.        Behavior: Behavior normal. Behavior is cooperative.     Assessment and Plan:   Assessment and Plan     Epigastric pain Epigastric pain and bloating exacerbated by eating, likely gastritis or potential ulceration due to ibuprofen. No H. pylori testing due to recent food intake. Risk of progression if untreated. - Prescribed sucralfate four times daily, 30 minutes before meals and once before bedtime, for two weeks. - Prescribed a PPI once daily on an empty stomach. - Advised to minimize ibuprofen use. - Ordered blood work to assess for underlying issues. - Performed urine pregnancy test to rule out pregnancy-related causes. - If sudden worsening of symptom(s), needs to go to the ER  Dysuria Symptoms consistent with past UTIs. Cephalexin  appropriate based on past cultures. - Instructed to take cephalexin  twice daily for seven days. - Advised to monitor for yeast infection and use boric acid if needed.  Vaginal odor  Ordered sexual transmitted infection testing for further evaluation        Lucie Buttner, PA-C    [1]  Social History Tobacco Use   Smoking status: Former    Types: E-cigarettes    Quit date: 10/13/2020    Years since quitting: 3.7   Smokeless tobacco: Never  Vaping Use   Vaping status: Every Day  Substance Use Topics   Alcohol use: Not Currently    Alcohol/week: 15.0 standard drinks of alcohol    Types: 15 Shots of liquor per week   Drug use: Never  [2]  Allergies Allergen Reactions   Food Itching    Walnuts    Other Anaphylaxis and Other (See Comments)    Walnuts & other tree nuts   Watermelon [Citrullus Vulgaris] Other (See Comments)    Itchy throat   Banana Rash  [3]  Current Outpatient Medications:    albuterol  (VENTOLIN  HFA) 108 (90 Base) MCG/ACT inhaler, Inhale 2 puffs into the lungs every 6 (six) hours as needed for wheezing or shortness of breath., Disp: 8 g, Rfl: 0   Brexpiprazole  (REXULTI ) 0.5 MG TABS, Take 1 tablet (0.5 mg total) by mouth at bedtime., Disp: 30 tablet, Rfl: 1   etonogestrel (NEXPLANON) 68 MG IMPL implant, , Disp: , Rfl:     lisdexamfetamine  (VYVANSE ) 30 MG capsule, Take 1 capsule (30 mg total) by mouth in the morning., Disp: 30 capsule, Rfl: 0   lisdexamfetamine  (VYVANSE ) 30 MG capsule, Take 1 capsule (30 mg total) by mouth every morning., Disp: 30 capsule, Rfl: 0   lisdexamfetamine  (VYVANSE ) 30 MG capsule, Take 1 capsule (30 mg total) by mouth every morning., Disp: 30 capsule, Rfl: 0   [START ON 08/10/2024] lisdexamfetamine  (VYVANSE ) 30 MG capsule, Take 1 capsule (30 mg total) by mouth every morning., Disp: 30 capsule, Rfl: 0   Multiple Vitamins-Minerals (MULTI-VITAMIN GUMMIES PO), Take 2 each by mouth daily in the afternoon., Disp: , Rfl:    pantoprazole (  PROTONIX) 40 MG tablet, Take 1 tablet (40 mg total) by mouth daily., Disp: 14 tablet, Rfl: 0   sucralfate (CARAFATE) 1 g tablet, Take 1 tablet (1 g total) by mouth 4 (four) times daily -  with meals and at bedtime., Disp: 28 tablet, Rfl: 0   propranolol  (INDERAL ) 10 MG tablet, Take 1 tablet (10 mg total) by mouth 2 (two) times daily. (Patient not taking: Reported on 07/26/2024), Disp: 30 tablet, Rfl: 0   propranolol  (INDERAL ) 10 MG tablet, Take 1 tablet (10 mg total) by mouth 2 (two) times daily. (Patient not taking: Reported on 07/26/2024), Disp: 30 tablet, Rfl: 0  "

## 2024-07-26 NOTE — Patient Instructions (Addendum)
" ° ° °  VISIT SUMMARY: During your visit, we addressed your shortness of breath, abdominal bloating, and other related symptoms. We discussed potential causes and prescribed medications to help manage your symptoms.  YOUR PLAN: GASTRITIS WITH EPIGASTRIC PAIN: You have epigastric pain and bloating that is likely due to gastritis or a potential ulcer, possibly from ibuprofen use. -Take sucralfate four times daily, 30 minutes before meals and once before bedtime, for two weeks. Take at 8a prior to 830 breakfast Take at 1130p prior to 12p snack Take at 430p prior to 5p dinner Take at 830p prior to 9p bedtime -Take a proton pump inhibitor (PPI) once daily on an empty stomach. -Minimize ibuprofen use. -We ordered blood work to check for underlying issues. -A urine pregnancy test was performed to rule out pregnancy-related causes.  URINARY TRACT INFECTION: Your symptoms are consistent with a urinary tract infection (UTI). -Take cephalexin  500 mg twice daily for seven days OR until you hear back from me about results  CONSTIPATION: You have chronic constipation which is contributing to your abdominal bloating and discomfort. -Increase your fiber intake and stay hydrated. -Consider using over-the-counter laxatives if needed.    Contains text generated by [employer].   "

## 2024-07-27 LAB — COMPREHENSIVE METABOLIC PANEL WITH GFR
AG Ratio: 1.4 (calc) (ref 1.0–2.5)
ALT: 19 U/L (ref 6–29)
AST: 20 U/L (ref 10–30)
Albumin: 4.6 g/dL (ref 3.6–5.1)
Alkaline phosphatase (APISO): 65 U/L (ref 31–125)
BUN: 8 mg/dL (ref 7–25)
CO2: 23 mmol/L (ref 20–32)
Calcium: 9.7 mg/dL (ref 8.6–10.2)
Chloride: 105 mmol/L (ref 98–110)
Creat: 0.83 mg/dL (ref 0.50–0.96)
Globulin: 3.2 g/dL (ref 1.9–3.7)
Glucose, Bld: 73 mg/dL (ref 65–99)
Potassium: 3.7 mmol/L (ref 3.5–5.3)
Sodium: 141 mmol/L (ref 135–146)
Total Bilirubin: 0.5 mg/dL (ref 0.2–1.2)
Total Protein: 7.8 g/dL (ref 6.1–8.1)
eGFR: 100 mL/min/{1.73_m2}

## 2024-07-27 LAB — CBC WITH DIFFERENTIAL/PLATELET
Absolute Lymphocytes: 2485 {cells}/uL (ref 850–3900)
Absolute Monocytes: 471 {cells}/uL (ref 200–950)
Basophils Absolute: 30 {cells}/uL (ref 0–200)
Basophils Relative: 0.4 %
Eosinophils Absolute: 167 {cells}/uL (ref 15–500)
Eosinophils Relative: 2.2 %
HCT: 41.9 % (ref 35.9–46.0)
Hemoglobin: 13.4 g/dL (ref 11.7–15.5)
MCH: 27.9 pg (ref 27.0–33.0)
MCHC: 32 g/dL (ref 31.6–35.4)
MCV: 87.3 fL (ref 81.4–101.7)
MPV: 11.3 fL (ref 7.5–12.5)
Monocytes Relative: 6.2 %
Neutro Abs: 4446 {cells}/uL (ref 1500–7800)
Neutrophils Relative %: 58.5 %
Platelets: 224 10*3/uL (ref 140–400)
RBC: 4.8 Million/uL (ref 3.80–5.10)
RDW: 12 % (ref 11.0–15.0)
Total Lymphocyte: 32.7 %
WBC: 7.6 10*3/uL (ref 3.8–10.8)

## 2024-07-27 LAB — URINE CULTURE
MICRO NUMBER:: 17508514
Result:: NO GROWTH
SPECIMEN QUALITY:: ADEQUATE

## 2024-07-27 LAB — CERVICOVAGINAL ANCILLARY ONLY
Bacterial Vaginitis (gardnerella): NEGATIVE
Candida Glabrata: NEGATIVE
Candida Vaginitis: NEGATIVE
Chlamydia: NEGATIVE
Comment: NEGATIVE
Comment: NEGATIVE
Comment: NEGATIVE
Comment: NEGATIVE
Comment: NEGATIVE
Comment: NORMAL
Neisseria Gonorrhea: NEGATIVE
Trichomonas: NEGATIVE

## 2024-07-27 LAB — SYPHILIS: RPR W/REFLEX TO RPR TITER AND TREPONEMAL ANTIBODIES, TRADITIONAL SCREENING AND DIAGNOSIS ALGORITHM: RPR Ser Ql: NONREACTIVE

## 2024-07-27 LAB — IRON,TIBC AND FERRITIN PANEL
%SAT: 28 % (ref 16–45)
Ferritin: 101 ng/mL (ref 16–154)
Iron: 96 ug/dL (ref 40–190)
TIBC: 344 ug/dL (ref 250–450)

## 2024-07-27 LAB — HIV ANTIBODY (ROUTINE TESTING W REFLEX)
HIV 1&2 Ab, 4th Generation: NONREACTIVE
HIV FINAL INTERPRETATION: NEGATIVE

## 2024-07-27 LAB — LIPASE: Lipase: 26 U/L (ref 7–60)

## 2024-07-27 LAB — TSH: TSH: 0.87 m[IU]/L

## 2024-07-28 ENCOUNTER — Ambulatory Visit: Payer: Self-pay | Admitting: Physician Assistant

## 2024-08-08 ENCOUNTER — Other Ambulatory Visit (HOSPITAL_COMMUNITY): Payer: Self-pay

## 2024-08-09 ENCOUNTER — Other Ambulatory Visit (HOSPITAL_COMMUNITY): Payer: Self-pay
